# Patient Record
Sex: Male | Born: 1955 | Race: White | Hispanic: No | Marital: Single | Smoking: Former smoker
Health system: Southern US, Community
[De-identification: ages and names within clinical notes are randomized; demographics above are authoritative.]

## PROBLEM LIST (undated history)

## (undated) DIAGNOSIS — Z9151 Personal history of suicidal behavior: Secondary | ICD-10-CM

## (undated) DIAGNOSIS — Z915 Personal history of self-harm: Secondary | ICD-10-CM

## (undated) DIAGNOSIS — F329 Major depressive disorder, single episode, unspecified: Secondary | ICD-10-CM

## (undated) DIAGNOSIS — K7030 Alcoholic cirrhosis of liver without ascites: Secondary | ICD-10-CM

## (undated) DIAGNOSIS — F4310 Post-traumatic stress disorder, unspecified: Secondary | ICD-10-CM

## (undated) DIAGNOSIS — K219 Gastro-esophageal reflux disease without esophagitis: Secondary | ICD-10-CM

## (undated) DIAGNOSIS — F419 Anxiety disorder, unspecified: Secondary | ICD-10-CM

## (undated) DIAGNOSIS — F32A Depression, unspecified: Secondary | ICD-10-CM

## (undated) DIAGNOSIS — M169 Osteoarthritis of hip, unspecified: Secondary | ICD-10-CM

## (undated) HISTORY — PX: WRIST SURGERY: SHX841

---

## 2014-12-14 ENCOUNTER — Emergency Department (HOSPITAL_COMMUNITY)
Admission: EM | Admit: 2014-12-14 | Discharge: 2014-12-15 | Disposition: A | Discharge status: Other Acute Inpatient Hospital | Payer: Medicaid Other | Attending: Emergency Medicine | Admitting: Emergency Medicine

## 2014-12-14 ENCOUNTER — Encounter (HOSPITAL_COMMUNITY): Payer: Self-pay | Admitting: *Deleted

## 2014-12-14 DIAGNOSIS — Z87891 Personal history of nicotine dependence: Secondary | ICD-10-CM | POA: Diagnosis not present

## 2014-12-14 DIAGNOSIS — R45851 Suicidal ideations: Secondary | ICD-10-CM | POA: Insufficient documentation

## 2014-12-14 DIAGNOSIS — R748 Abnormal levels of other serum enzymes: Secondary | ICD-10-CM | POA: Insufficient documentation

## 2014-12-14 DIAGNOSIS — Z8739 Personal history of other diseases of the musculoskeletal system and connective tissue: Secondary | ICD-10-CM | POA: Insufficient documentation

## 2014-12-14 DIAGNOSIS — Z8719 Personal history of other diseases of the digestive system: Secondary | ICD-10-CM | POA: Diagnosis not present

## 2014-12-14 DIAGNOSIS — F10920 Alcohol use, unspecified with intoxication, uncomplicated: Secondary | ICD-10-CM

## 2014-12-14 HISTORY — DX: Personal history of suicidal behavior: Z91.51

## 2014-12-14 HISTORY — DX: Depression, unspecified: F32.A

## 2014-12-14 HISTORY — DX: Osteoarthritis of hip, unspecified: M16.9

## 2014-12-14 HISTORY — DX: Alcoholic cirrhosis of liver without ascites: K70.30

## 2014-12-14 HISTORY — DX: Major depressive disorder, single episode, unspecified: F32.9

## 2014-12-14 HISTORY — DX: Gastro-esophageal reflux disease without esophagitis: K21.9

## 2014-12-14 HISTORY — DX: Anxiety disorder, unspecified: F41.9

## 2014-12-14 HISTORY — DX: Personal history of self-harm: Z91.5

## 2014-12-14 HISTORY — DX: Post-traumatic stress disorder, unspecified: F43.10

## 2014-12-14 LAB — COMPREHENSIVE METABOLIC PANEL
ALT: 363 U/L — AB (ref 0–53)
AST: 206 U/L — ABNORMAL HIGH (ref 0–37)
Albumin: 4.5 g/dL (ref 3.5–5.2)
Alkaline Phosphatase: 84 U/L (ref 39–117)
Anion gap: 11 (ref 5–15)
BUN: 8 mg/dL (ref 6–23)
CHLORIDE: 102 mmol/L (ref 96–112)
CO2: 26 mmol/L (ref 19–32)
CREATININE: 1.05 mg/dL (ref 0.50–1.35)
Calcium: 10.2 mg/dL (ref 8.4–10.5)
GFR, EST AFRICAN AMERICAN: 89 mL/min — AB (ref >90)
GFR, EST NON AFRICAN AMERICAN: 77 mL/min — AB (ref >90)
GLUCOSE: 140 mg/dL — AB (ref 70–99)
Potassium: 3.9 mmol/L (ref 3.5–5.1)
Sodium: 139 mmol/L (ref 135–145)
Total Bilirubin: 1.3 mg/dL — ABNORMAL HIGH (ref 0.3–1.2)
Total Protein: 8.1 g/dL (ref 6.0–8.3)

## 2014-12-14 LAB — SALICYLATE LEVEL

## 2014-12-14 LAB — CBC
HCT: 48.7 % (ref 39.0–52.0)
Hemoglobin: 17 g/dL (ref 13.0–17.0)
MCH: 34.2 pg — ABNORMAL HIGH (ref 26.0–34.0)
MCHC: 34.9 g/dL (ref 30.0–36.0)
MCV: 98 fL (ref 78.0–100.0)
Platelets: 220 10*3/uL (ref 150–400)
RBC: 4.97 MIL/uL (ref 4.22–5.81)
RDW: 13 % (ref 11.5–15.5)
WBC: 7.6 10*3/uL (ref 4.0–10.5)

## 2014-12-14 LAB — ETHANOL: Alcohol, Ethyl (B): 91 mg/dL — ABNORMAL HIGH (ref 0–9)

## 2014-12-14 LAB — ACETAMINOPHEN LEVEL: Acetaminophen (Tylenol), Serum: <10 ug/mL — ABNORMAL LOW (ref 10–30)

## 2014-12-14 MED ORDER — ONDANSETRON HCL 4 MG PO TABS: 4.0000 mg | ORAL_TABLET | Freq: Three times a day (TID) | ORAL | Status: DC | PRN

## 2014-12-14 MED ORDER — ALUM & MAG HYDROXIDE-SIMETH 200-200-20 MG/5ML PO SUSP: 30.0000 mL | ORAL | Status: DC | PRN

## 2014-12-14 MED ORDER — ACETAMINOPHEN 325 MG PO TABS: 650.0000 mg | ORAL_TABLET | ORAL | Status: DC | PRN

## 2014-12-14 MED ORDER — IBUPROFEN 200 MG PO TABS: 600.0000 mg | ORAL_TABLET | Freq: Three times a day (TID) | ORAL | Status: DC | PRN

## 2014-12-14 NOTE — ED Notes (Signed)
Pt states that he has been out of neurontin, wellbutrin and risperdal x 6 months. Pt c/o "rushing thoughts."  When asked if he wants to harm himself he states, "I don't know." pt states that he does not right now but he gets triggered easily and "goes into a range." states that when he gets into these rages he is not sure what will happen. States he needs to get back on his meds. States that he had an ACT team in [place], moved here 8 months ago. His ACT team wants him to re-establish care here. States that they did attempt to help him.

## 2014-12-14 NOTE — ED Notes (Signed)
Pt also c/o self medicating with alcohol. States that he drinks about 6 beers a day. States he drank and smoked weed before coming to ED.

## 2014-12-14 NOTE — ED Provider Notes (Signed)
CSN: [id number]     Arrival date & time 12/14/14  1841 History   First MD Initiated Contact with Patient 12/14/14 2208     Chief Complaint  Patient presents with  . Medical Clearance     (Consider location/radiation/quality/duration/timing/severity/associated sxs/prior Treatment) HPI Comments: Patient is a 59 year old male with a past medical history of PTSD, depression, and previous suicide attempt who presents with depression and suicidal ideas. Patient reports the symptoms have worsened since being off his medication for the past 6 months. He was seeing a psychiatrist in [place] that was managing his medication but he has since moved to [place]. Patient reports being increasingly irritable and having thoughts of hurting himself and others without a specific plan. Patient reports he self-medicates with 6-14 beers per day and occasional marijuana use. He thinks he will improve if he gets back on his medication.    Past Medical History  Diagnosis Date  . PTSD (post-traumatic stress disorder)   . Cirrhosis, alcoholic   . Acid reflux   . Degenerative arthritis of hip   . H/O: attempted suicide    Past Surgical History  Procedure Laterality Date  . Wrist surgery     No family history on file. History  Substance Use Topics  . Smoking status: Former Games developer  . Smokeless tobacco: Not on file  . Alcohol Use: 42.0 oz/week    70 Cans of beer per week    Review of Systems  Psychiatric/Behavioral: Positive for suicidal ideas and dysphoric mood.  All other systems reviewed and are negative.     Allergies  Morphine and related  Home Medications   Prior to Admission medications   Not on File   BP 148/88 mmHg  Pulse 93  Temp(Src) 98.3 F (36.8 C)  Resp 20  Wt 177 lb 5 oz (80.428 kg)  SpO2 98% Physical Exam  Constitutional: He is oriented to person, place, and time. He appears well-developed and well-nourished. No distress.  HENT:  Head: Normocephalic and atraumatic.   Eyes: Conjunctivae are normal.  Neck: Normal range of motion.  Cardiovascular: Normal rate and regular rhythm.  Exam reveals no gallop and no friction rub.   No murmur heard. Pulmonary/Chest: Effort normal and breath sounds normal. He has no wheezes. He has no rales. He exhibits no tenderness.  Abdominal: Soft. He exhibits no distension. There is no tenderness. There is no rebound.  Musculoskeletal: Normal range of motion.  Neurological: He is alert and oriented to person, place, and time. Coordination normal.  Speech is goal-oriented. Moves limbs without ataxia.   Skin: Skin is warm and dry.  Psychiatric:  Flat affect. Dysphoric mood.   Nursing note and vitals reviewed.   ED Course  Procedures (including critical care time) Labs Review Labs Reviewed  ACETAMINOPHEN LEVEL - Abnormal; Notable for the following:    Acetaminophen (Tylenol), Serum <10.0 (*)    All other components within normal limits  CBC - Abnormal; Notable for the following:    MCH 34.2 (*)    All other components within normal limits  COMPREHENSIVE METABOLIC PANEL - Abnormal; Notable for the following:    Glucose, Bld 140 (*)    AST 206 (*)    ALT 363 (*)    Total Bilirubin 1.3 (*)    GFR calc non Af Amer 77 (*)    GFR calc Af Amer 89 (*)    All other components within normal limits  ETHANOL - Abnormal; Notable for the following:  Alcohol, Ethyl (B) 91 (*)    All other components within normal limits  SALICYLATE LEVEL  URINE RAPID DRUG SCREEN (HOSP PERFORMED)    Imaging Review No results found.   EKG Interpretation None      MDM   Final diagnoses:  Suicidal ideation  Alcohol intoxication, uncomplicated  Elevated liver enzymes    Patient will have telepsych evaluation.     Emilia BeckKaitlyn Verdie Barrows, PA-C 12/15/14 [id number]  Shon Batonourtney F Horton, MD 12/16/14 2002

## 2014-12-14 NOTE — ED Notes (Signed)
Patient states that he is unable to continue to get is antidepressant medication. States that he has "been down this road before." He states that he feels he might be a danger to himself or others if is off his medication for too long.

## 2014-12-15 ENCOUNTER — Encounter (HOSPITAL_COMMUNITY): Payer: Self-pay | Admitting: *Deleted

## 2014-12-15 ENCOUNTER — Inpatient Hospital Stay (HOSPITAL_COMMUNITY)
Admission: EM | Admit: 2014-12-15 | Discharge: 2014-12-21 | DRG: 885 | Disposition: A | Discharge status: Home / Self Care | Payer: MEDICAID | Source: Intra-hospital | Attending: Psychiatry | Admitting: Psychiatry

## 2014-12-15 DIAGNOSIS — K7030 Alcoholic cirrhosis of liver without ascites: Secondary | ICD-10-CM | POA: Diagnosis not present

## 2014-12-15 DIAGNOSIS — M549 Dorsalgia, unspecified: Secondary | ICD-10-CM | POA: Diagnosis present

## 2014-12-15 DIAGNOSIS — F10988 Alcohol use, unspecified with other alcohol-induced disorder: Secondary | ICD-10-CM

## 2014-12-15 DIAGNOSIS — F10239 Alcohol dependence with withdrawal, unspecified: Secondary | ICD-10-CM | POA: Diagnosis present

## 2014-12-15 DIAGNOSIS — F319 Bipolar disorder, unspecified: Principal | ICD-10-CM | POA: Diagnosis present

## 2014-12-15 DIAGNOSIS — F419 Anxiety disorder, unspecified: Secondary | ICD-10-CM | POA: Diagnosis not present

## 2014-12-15 DIAGNOSIS — F3130 Bipolar disorder, current episode depressed, mild or moderate severity, unspecified: Secondary | ICD-10-CM | POA: Diagnosis not present

## 2014-12-15 DIAGNOSIS — F1020 Alcohol dependence, uncomplicated: Secondary | ICD-10-CM | POA: Diagnosis present

## 2014-12-15 DIAGNOSIS — M1610 Unilateral primary osteoarthritis, unspecified hip: Secondary | ICD-10-CM | POA: Diagnosis present

## 2014-12-15 DIAGNOSIS — F4310 Post-traumatic stress disorder, unspecified: Secondary | ICD-10-CM

## 2014-12-15 DIAGNOSIS — F10230 Alcohol dependence with withdrawal, uncomplicated: Secondary | ICD-10-CM | POA: Diagnosis not present

## 2014-12-15 DIAGNOSIS — F10188 Alcohol abuse with other alcohol-induced disorder: Secondary | ICD-10-CM | POA: Diagnosis present

## 2014-12-15 DIAGNOSIS — R45851 Suicidal ideations: Secondary | ICD-10-CM | POA: Diagnosis not present

## 2014-12-15 LAB — LIPID PANEL
Cholesterol: 166 mg/dL (ref 0–200)
HDL: 59 mg/dL (ref >39)
LDL Cholesterol: 88 mg/dL (ref 0–99)
TRIGLYCERIDES: 93 mg/dL (ref <150)
Total CHOL/HDL Ratio: 2.8 RATIO
VLDL: 19 mg/dL (ref 0–40)

## 2014-12-15 LAB — RAPID URINE DRUG SCREEN, HOSP PERFORMED
AMPHETAMINES: NOT DETECTED
BARBITURATES: NOT DETECTED
BENZODIAZEPINES: POSITIVE — AB
COCAINE: NOT DETECTED
OPIATES: NOT DETECTED
TETRAHYDROCANNABINOL: POSITIVE — AB

## 2014-12-15 LAB — TSH: TSH: 5.292 u[IU]/mL — AB (ref 0.350–4.500)

## 2014-12-15 MED ORDER — LORAZEPAM 1 MG PO TABS: 1.0000 mg | ORAL_TABLET | Freq: Four times a day (QID) | ORAL | Status: AC | PRN

## 2014-12-15 MED ORDER — ACETAMINOPHEN 325 MG PO TABS
650.0000 mg | ORAL_TABLET | Freq: Four times a day (QID) | ORAL | Status: DC | PRN
Start: 2014-12-15 — End: 2014-12-15

## 2014-12-15 MED ORDER — BUPROPION HCL ER (XL) 150 MG PO TB24
150.0000 mg | ORAL_TABLET | Freq: Every day | ORAL | Status: DC
  Administered 2014-12-15 – 2014-12-21 (×7): 150 mg via ORAL
  Filled 2014-12-15 (×3): qty 1
  Filled 2014-12-15: qty 14
  Filled 2014-12-15 (×5): qty 1

## 2014-12-15 MED ORDER — ONDANSETRON 4 MG PO TBDP: 4.0000 mg | ORAL_TABLET | Freq: Four times a day (QID) | ORAL | Status: AC | PRN

## 2014-12-15 MED ORDER — LORAZEPAM 1 MG PO TABS
1.0000 mg | ORAL_TABLET | Freq: Two times a day (BID) | ORAL | Status: AC
  Administered 2014-12-17 (×2): 1 mg via ORAL
  Filled 2014-12-15 (×2): qty 1

## 2014-12-15 MED ORDER — RISPERIDONE 2 MG PO TABS
2.0000 mg | ORAL_TABLET | Freq: Every day | ORAL | Status: DC
  Administered 2014-12-15 – 2014-12-20 (×6): 2 mg via ORAL
  Filled 2014-12-15: qty 1
  Filled 2014-12-15: qty 14
  Filled 2014-12-15 (×6): qty 1

## 2014-12-15 MED ORDER — LORAZEPAM 1 MG PO TABS
1.0000 mg | ORAL_TABLET | Freq: Four times a day (QID) | ORAL | Status: AC
  Administered 2014-12-15 (×4): 1 mg via ORAL
  Filled 2014-12-15 (×4): qty 1

## 2014-12-15 MED ORDER — MAGNESIUM HYDROXIDE 400 MG/5ML PO SUSP: 30.0000 mL | Freq: Every day | ORAL | Status: DC | PRN

## 2014-12-15 MED ORDER — TRAZODONE HCL 50 MG PO TABS
50.0000 mg | ORAL_TABLET | Freq: Every evening | ORAL | Status: DC | PRN
  Administered 2014-12-15 – 2014-12-20 (×8): 50 mg via ORAL
  Filled 2014-12-15 (×16): qty 1

## 2014-12-15 MED ORDER — LORAZEPAM 1 MG PO TABS
1.0000 mg | ORAL_TABLET | Freq: Three times a day (TID) | ORAL | Status: AC
  Administered 2014-12-16 (×3): 1 mg via ORAL
  Filled 2014-12-15 (×3): qty 1

## 2014-12-15 MED ORDER — LOPERAMIDE HCL 2 MG PO CAPS: 2.0000 mg | ORAL_CAPSULE | ORAL | Status: AC | PRN

## 2014-12-15 MED ORDER — THIAMINE HCL 100 MG/ML IJ SOLN
100.0000 mg | Freq: Once | INTRAMUSCULAR | Status: DC
  Filled 2014-12-15: qty 2

## 2014-12-15 MED ORDER — ALUM & MAG HYDROXIDE-SIMETH 200-200-20 MG/5ML PO SUSP: 30.0000 mL | ORAL | Status: DC | PRN

## 2014-12-15 MED ORDER — GABAPENTIN 300 MG PO CAPS
300.0000 mg | ORAL_CAPSULE | Freq: Four times a day (QID) | ORAL | Status: DC
  Administered 2014-12-15 – 2014-12-16 (×5): 300 mg via ORAL
  Filled 2014-12-15 (×8): qty 1

## 2014-12-15 MED ORDER — ADULT MULTIVITAMIN W/MINERALS CH
1.0000 | ORAL_TABLET | Freq: Every day | ORAL | Status: DC
  Administered 2014-12-15 – 2014-12-21 (×7): 1 via ORAL
  Filled 2014-12-15: qty 1
  Filled 2014-12-15: qty 14
  Filled 2014-12-15 (×8): qty 1

## 2014-12-15 MED ORDER — LORAZEPAM 1 MG PO TABS
1.0000 mg | ORAL_TABLET | Freq: Every day | ORAL | Status: AC
  Administered 2014-12-18: 1 mg via ORAL
  Filled 2014-12-15: qty 1

## 2014-12-15 MED ORDER — VITAMIN B-1 100 MG PO TABS
100.0000 mg | ORAL_TABLET | Freq: Every day | ORAL | Status: DC
  Administered 2014-12-16 – 2014-12-21 (×6): 100 mg via ORAL
  Filled 2014-12-15 (×8): qty 1

## 2014-12-15 MED ORDER — PANTOPRAZOLE SODIUM 40 MG PO TBEC
40.0000 mg | DELAYED_RELEASE_TABLET | Freq: Two times a day (BID) | ORAL | Status: DC
  Administered 2014-12-15 – 2014-12-21 (×12): 40 mg via ORAL
  Filled 2014-12-15 (×2): qty 1
  Filled 2014-12-15: qty 28
  Filled 2014-12-15 (×7): qty 1
  Filled 2014-12-15: qty 28
  Filled 2014-12-15 (×5): qty 1

## 2014-12-15 MED ORDER — CYCLOBENZAPRINE HCL 5 MG PO TABS
7.5000 mg | ORAL_TABLET | Freq: Three times a day (TID) | ORAL | Status: DC | PRN
  Administered 2014-12-15 – 2014-12-17 (×3): 7.5 mg via ORAL
  Filled 2014-12-15: qty 2
  Filled 2014-12-15: qty 1.5

## 2014-12-15 MED ORDER — HYDROXYZINE HCL 25 MG PO TABS: 25.0000 mg | ORAL_TABLET | Freq: Four times a day (QID) | ORAL | Status: AC | PRN

## 2014-12-15 NOTE — H&P (Signed)
Psychiatric Admission Assessment Adult  Patient Identification: William Perez MRN:  [id number] Date of Evaluation:  12/15/2014 Chief Complaint:  Bipolar Disorder Principal Diagnosis: Alcohol abuse with alcohol-induced mental disorder Diagnosis:   Patient Active Problem List   Diagnosis Date Noted  . 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 male who presents to [facility] with SI thoughts x1 month with worsening depression, anxiety and racing thoughts. Pt states he has a plan to overdose on pills. He admits to 2 previous SI attempts by jumping from bridge and overdosing. Pt states he moved from [place] 6 months ago and says he was engaged in outpatient services with ACT in [place]. Pt says he's been off his medications x6 months. Pt says he has become volatile and racing thoughts and drinking to self-medicate with alcohol and THC. He admits drinking 6-12 12pks of beer, daily. His last drink was 12/14/14, he drank 1-16oz beer. He also smokes 1 marijuana cigarette 2-3x's a week. His last use was 2 days ago. Pt says he legal issues--communicating threats, his court date in June 2016. Pt says he unable to obtain his antidepressant medication and wants to get back on his medications.   Today he was seen.  He states that he ran out of meds for a month and is the primary reason why he is in the hospital.  He states that he was brought in by a friend.  He reports being agitated.  He denies SI/HI or even being treated at [facility] but at other places in and out of the state.  His diagnoses include anxiety, depression and PTSD.  He did not care to elaborate at this time.  He also had been with ACT Team out of [place].  His recent meds include Gabapentin and Bupropion.    Elements:  Location:  Agitation, substance abuse. Quality:  Feeling of anxiety, agitation and hopelessness. Severity:  severe. Timing:  in the last  couple of days. Duration:  chronic, ongoing. Context:  see HPI. Associated Signs/Symptoms: Depression Symptoms:   (Hypo) Manic Symptoms:  Irritable Mood, Anxiety Symptoms:  Social Anxiety, Psychotic Symptoms:  NA PTSD Symptoms: patient did not want to share at this moment Total Time spent with patient: 30 minutes  Past Medical History:  Past Medical History  Diagnosis Date  . PTSD (post-traumatic stress disorder)   . Cirrhosis, alcoholic   . Acid reflux   . Degenerative arthritis of hip   . H/O: attempted suicide   . Depression   . Anxiety     Past Surgical History  Procedure Laterality Date  . Wrist surgery     Family History: History reviewed. No pertinent family history. Social History:  History  Alcohol Use  . 42.0 oz/week  . 70 Cans of beer per week    Comment: 6 to 12 beers daily depending on "state of mind"     History  Drug Use  . 4.00 per week  . Special: Marijuana    Comment: 2 to 3 times weekly    History   Social History  . Marital Status: Single    Spouse Name: N/A  . Number of Children: N/A  . Years of Education: N/A   Social History Main Topics  . Smoking status: Former Games developer  . Smokeless tobacco: Not on file  . Alcohol Use: 42.0 oz/week    70 Cans of beer per week     Comment: 6 to 12 beers  daily depending on "state of mind"  . Drug Use: 4.00 per week    Special: Marijuana     Comment: 2 to 3 times weekly  . Sexual Activity: Yes    Birth Control/ Protection: None   Other Topics Concern  . None   Social History Narrative   Additional Social History:    Pain Medications: see mar Prescriptions: see mar Over the Counter: see mar History of alcohol / drug use?: Yes Longest period of sobriety (when/how long): apprx 9 months in 2013 Negative Consequences of Use: Financial, Legal, Personal relationships  Musculoskeletal: Strength & Muscle Tone: within normal limits Gait & Station: normal Patient leans: N/A  Psychiatric Specialty  Exam: Physical Exam  Vitals reviewed. Psychiatric: His mood appears anxious.    Review of Systems  Psychiatric/Behavioral: Positive for depression. The patient is nervous/anxious.   All other systems reviewed and are negative.   Blood pressure 122/92, pulse 81, temperature 98.2 F (36.8 C), temperature source Oral, resp. rate 16, height 5' 9.75" (1.772 m), weight 78.926 kg (174 lb).Body mass index is 25.14 kg/(m^2).  General Appearance: Fairly Groomed  Patent attorney::  Fair  Speech:  Normal Rate  Volume:  Normal  Mood:  Anxious  Affect:  Depressed and Flat  Thought Process:  Linear  Orientation:  Full (Time, Place, and Person)  Thought Content:  Rumination  Suicidal Thoughts:  No  Homicidal Thoughts:  No  Memory:  Immediate;   Fair Recent;   Fair Remote;   Fair  Judgement:  Fair  Insight:  Fair  Psychomotor Activity:  Normal  Concentration:  Good  Recall:  Good  Fund of Knowledge:Good  Language: Good  Akathisia:  Negative  Handed:  Right  AIMS (if indicated):     Assets:  Desire for Improvement Resilience Social Support  ADL's:  Intact  Cognition: WNL  Sleep:      Risk to Self:   Risk to Others:   Prior Inpatient Therapy:   Prior Outpatient Therapy:    Alcohol Screening: 1. How often do you have a drink containing alcohol?: 4 or more times a week 2. How many drinks containing alcohol do you have on a typical day when you are drinking?: 7, 8, or 9 3. How often do you have six or more drinks on one occasion?: Daily or almost daily Preliminary Score: 7 4. How often during the last year have you found that you were not able to stop drinking once you had started?: Daily or almost daily 5. How often during the last year have you failed to do what was normally expected from you becasue of drinking?: Less than monthly 6. How often during the last year have you needed a first drink in the morning to get yourself going after a heavy drinking session?: Weekly 7. How often  during the last year have you had a feeling of guilt of remorse after drinking?: Weekly 8. How often during the last year have you been unable to remember what happened the night before because you had been drinking?: Daily or almost daily 9. Have you or someone else been injured as a result of your drinking?: Yes, but not in the last year 10. Has a relative or friend or a doctor or another health worker been concerned about your drinking or suggested you cut down?: Yes, during the last year Alcohol Use Disorder Identification Test Final Score (AUDIT): 32 Brief Intervention: Yes  Allergies:   Allergies  Allergen Reactions  . Morphine  And Related Hives   Lab Results:  Results for orders placed or performed during the hospital encounter of 12/15/14 (from the past 48 hour(s))  Lipid panel, fasting     Status: None   Collection Time: 12/15/14  6:45 AM  Result Value Ref Range   Cholesterol 166 0 - 200 mg/dL   Triglycerides 93 <161<150 mg/dL   HDL 59 >09>39 mg/dL   Total CHOL/HDL Ratio 2.8 RATIO   VLDL 19 0 - 40 mg/dL   LDL Cholesterol 88 0 - 99 mg/dL    Comment:        Total Cholesterol/HDL:CHD Risk Coronary Heart Disease Risk Table                     Men   Women  1/2 Average Risk   3.4   3.3  Average Risk       5.0   4.4  2 X Average Risk   9.6   7.1  3 X Average Risk  23.4   11.0        Use the calculated Patient Ratio above and the CHD Risk Table to determine the patient's CHD Risk.        ATP III CLASSIFICATION (LDL):  <100     mg/dL   Optimal  [id number]  mg/dL   Near or Above                    Optimal  130-159  mg/dL   Borderline  [id number]  mg/dL   High  >478>190     mg/dL   Very High Performed at [facility]   TSH     Status: Abnormal   Collection Time: 12/15/14  6:45 AM  Result Value Ref Range   TSH 5.292 (H) 0.350 - 4.500 uIU/mL    Comment: Performed at [facility]   Current Medications: Current Facility-Administered Medications  Medication Dose  Route Frequency Provider Last Rate Last Dose  . alum & mag hydroxide-simeth (MAALOX/MYLANTA) 200-200-20 MG/5ML suspension 30 mL  30 mL Oral Q4H PRN Kerry HoughSpencer E Simon, PA-C      . hydrOXYzine (ATARAX/VISTARIL) tablet 25 mg  25 mg Oral Q6H PRN Kerry HoughSpencer E Simon, PA-C      . loperamide (IMODIUM) capsule 2-4 mg  2-4 mg Oral PRN Kerry HoughSpencer E Simon, PA-C      . LORazepam (ATIVAN) tablet 1 mg  1 mg Oral Q6H PRN Kerry HoughSpencer E Simon, PA-C      . LORazepam (ATIVAN) tablet 1 mg  1 mg Oral QID Kerry HoughSpencer E Simon, PA-C   1 mg at 12/15/14 1202   Followed by  . [START ON 12/16/2014] LORazepam (ATIVAN) tablet 1 mg  1 mg Oral TID Kerry HoughSpencer E Simon, PA-C       Followed by  . [START ON 12/17/2014] LORazepam (ATIVAN) tablet 1 mg  1 mg Oral BID Kerry HoughSpencer E Simon, PA-C       Followed by  . [START ON 12/18/2014] LORazepam (ATIVAN) tablet 1 mg  1 mg Oral Daily Spencer E Simon, PA-C      . magnesium hydroxide (MILK OF MAGNESIA) suspension 30 mL  30 mL Oral Daily PRN Kerry HoughSpencer E Simon, PA-C      . multivitamin with minerals tablet 1 tablet  1 tablet Oral Daily Kerry HoughSpencer E Simon, PA-C   1 tablet at 12/15/14 [id number]  . ondansetron (ZOFRAN-ODT) disintegrating tablet 4 mg  4 mg Oral Q6H PRN Kerry HoughSpencer E Simon, PA-C      .  thiamine (B-1) injection 100 mg  100 mg Intramuscular Once Kerry Hough, PA-C   100 mg at 12/15/14 1610  . [START ON 12/16/2014] thiamine (VITAMIN B-1) tablet 100 mg  100 mg Oral Daily Kerry Hough, PA-C      . traZODone (DESYREL) tablet 50 mg  50 mg Oral QHS,MR X 1 Kerry Hough, PA-C       PTA Medications: Prescriptions prior to admission  Medication Sig Dispense Refill Last Dose  . gabapentin (NEURONTIN) 800 MG tablet Take 800 mg by mouth 4 (four) times daily.   Past Month at Unknown time  . Multiple Vitamin (MULTIVITAMIN WITH MINERALS) TABS tablet Take 1 tablet by mouth daily.   Past Week at Unknown time    Previous Psychotropic Medications: Yes   Substance Abuse History in the last 12 months:  Yes.    Consequences of  Substance Abuse: crisis management  Results for orders placed or performed during the hospital encounter of 12/15/14 (from the past 72 hour(s))  Lipid panel, fasting     Status: None   Collection Time: 12/15/14  6:45 AM  Result Value Ref Range   Cholesterol 166 0 - 200 mg/dL   Triglycerides 93 <960 mg/dL   HDL 59 >45 mg/dL   Total CHOL/HDL Ratio 2.8 RATIO   VLDL 19 0 - 40 mg/dL   LDL Cholesterol 88 0 - 99 mg/dL    Comment:        Total Cholesterol/HDL:CHD Risk Coronary Heart Disease Risk Table                     Men   Women  1/2 Average Risk   3.4   3.3  Average Risk       5.0   4.4  2 X Average Risk   9.6   7.1  3 X Average Risk  23.4   11.0        Use the calculated Patient Ratio above and the CHD Risk Table to determine the patient's CHD Risk.        ATP III CLASSIFICATION (LDL):  <100     mg/dL   Optimal  409-811  mg/dL   Near or Above                    Optimal  130-159  mg/dL   Borderline  914-782  mg/dL   High  >956     mg/dL   Very High Performed at [facility]   TSH     Status: Abnormal   Collection Time: 12/15/14  6:45 AM  Result Value Ref Range   TSH 5.292 (H) 0.350 - 4.500 uIU/mL    Comment: Performed at [facility]    Observation Level/Precautions:  15 minute checks  Laboratory:  per ED  Psychotherapy:  group  Medications:  As per medlist  Consultations:  As needed  Discharge Concerns:  safety  Estimated LOS:  2-7 days  Other:     Psychological Evaluations: Yes   Treatment Plan Summary: Admit for crisis management and mood stabilization. Medication management to re-stabilize current mood symptoms.  Gabapentin 300 mg QID mood sx, Riperdal 2 mg QHS for mood stabilization, Bupropion XL 150 mg QD for depression. Group counseling sessions for coping skills Medical consults as needed Review and reinstate any pertinent home medications for other health problems   Medical Decision Making:  Review of Psycho-Social Stressors  (1), Discuss test with performing physician (  1), Decision to obtain old records (1), Review and summation of old records (2) and Review of New Medication or Change in Dosage (2)  I certify that inpatient services furnished can reasonably be expected to improve the patient's condition.   Velna Hatchet May Agustin AGNP-BC 4/28/201612:31 PM I personally assessed the patient, reviewed the physical exam and labs and formulated the treatment plan Madie Reno A. Dub Mikes, M.D.

## 2014-12-15 NOTE — Progress Notes (Signed)
Pt was pleasant and cooperative, but flat and depressed during the adm process. When asked about the circumstances surrounding his adm pt stated, "got to get on my meds. Things have gotten too far out crazy". Pt stated this several times and always added referred to his relationship with his girlfriend. However, pt wouldn't go into details with the writer even after being asked several times. At one point pt stated, "i've been edgy". Pt moved from [place] apprx 8 months ago to live his his gf. Stated he only had enough medicine to last for 3 months but that he "stretched it" so it would last longer. Stated that in [place] he was followed by an ACT team. Pt states he's been self medicating with etoh and hasn't taken any medications in 4 to 6 wks. Per report pt stated, "If i don't have my meds I don't know what might happen. Hurt myself or somebody else". Per pt there is a history of GERD, DDD, L hip problems, cirrhosis and ulcers. 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 Pt voiced no questions or concerns.

## 2014-12-15 NOTE — [facility] Group Notes (Signed)
The focus of this group is to educate the patient on the purpose and policies of crisis stabilization and provide a format to answer questions about their admission.  The group details unit policies and expectations of patients while admitted. Patient did not attend this group. 

## 2014-12-15 NOTE — ED Provider Notes (Signed)
Patient has been accepted at [facility] by Dr. Dub MikesLugo.   Dione Boozeavid Amerika Nourse, MD 12/15/14 [phone]

## 2014-12-15 NOTE — [facility] Group Notes (Signed)
[facility] LCSW Group Therapy  12/15/2014 3:13 PM  Type of Therapy:  Group Therapy  Participation Level:  Did Not Attend-pt invited. Chose to remain in bed.   Summary of Progress/Problems: Today's Topic: Overcoming Obstacles. Pt identified obstacles faced currently and processed barriers involved in overcoming these obstacles. Pt identified steps necessary for overcoming these obstacles and explored motivation (internal and external) for facing these difficulties head on. Pt further identified one area of concern in their lives and chose a skill of focus pulled from their "toolbox."   Smart, [place] LCSWA  12/15/2014, 3:13 PM

## 2014-12-15 NOTE — Tx Team (Signed)
Initial Interdisciplinary Treatment Plan   PATIENT STRESSORS: Financial difficulties Health problems Legal issue Marital or family conflict Medication change or noncompliance Substance abuse   PATIENT STRENGTHS: Average or above average intelligence Capable of independent living Communication skills General fund of knowledge Motivation for treatment/growth Physical Health Work skills   PROBLEM LIST: Problem List/Patient Goals Date to be addressed Date deferred Reason deferred Estimated date of resolution  " Got to get back on my meds things have gotten to far to crazy" 12/15/14           Depression 12/15/14     Increased risk for suicide 12/15/14     Substance abuse 12/15/14     Concurrent medical condition 12/15/14                        DISCHARGE CRITERIA:  Ability to meet basic life and health needs Adequate post-discharge living arrangements Improved stabilization in mood, thinking, and/or behavior Medical problems require only outpatient monitoring Motivation to continue treatment in a less acute level of care Need for constant or close observation no longer present Reduction of life-threatening or endangering symptoms to within safe limits Safe-care adequate arrangements made Verbal commitment to aftercare and medication compliance Withdrawal symptoms are absent or subacute and managed without 24-hour nursing intervention  PRELIMINARY DISCHARGE PLAN: Attend 12-step recovery group Outpatient therapy Participate in family therapy Return to previous living arrangement  PATIENT/FAMIILY INVOLVEMENT: This treatment plan has been presented to and reviewed with the patient, Suan HalterDale Rickey, and/or family member.  The patient and family have been given the opportunity to ask questions and make suggestions.  Fransico MichaelBrooks, Nickalos Petersen Laverne 12/15/2014, 6:13 AM

## 2014-12-15 NOTE — Progress Notes (Signed)
D: Patient denies SI/HI and A/V hallucinations  A: Monitored q 15 minutes; patient encouraged to attend groups; patient educated about medications; patient given medications per physician orders; patient encouraged to express feelings and/or concerns  R: Patient is fatigued and has been in the bed most of the day; patient is cooperative and pleasant; patient's interaction with staff and peers is minimal; patient was able to set goal to talk with staff 1:1 when having feelings of SI; patient is taking medications as prescribed and tolerating medications; patient is attended the afternoon group
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
## 2014-12-15 NOTE — BH Assessment (Signed)
Tele Assessment Note   William Perez is a 59 y.o. male who presents to [facility] with SI thoughts x1 month with worsening depression, anxiety and racing thoughts. Pt states he has a plan to overdose on pills.  He admits 2 previous SI attempts by jumping from bridge and overdosing.  Pt states he moved from [place] 6 months ago and says he was engaged in outpatient services with ACT in [place].  Pt says he's been off his medications x6 months. Pt says he has become volatile and racing thoughts and drinking to self-medicate with alcohol and thc.  He admits drinking 6-12 12pks of beer, daily. His last drink was 12/14/14, he drank 1-16oz beer.  He also smokes 1 marijuana cigarette 2-3x's a week. His last use was 2 days ago.  Pt says he legal issues--communicating threats, his court date in June 2016.  Pt says he unable to obtain his antidepressant medication and wants to get back on his medications.    Axis I: Bipolar I disorder, Current or most recent episode depressed, Severe;Posttraumatic stress disorder; Alcohol use disorder, Severe;Cannabis use disorder, Mild   Axis II: Deferred Axis III:  Past Medical History  Diagnosis Date  . PTSD (post-traumatic stress disorder)   . Cirrhosis, alcoholic   . Acid reflux   . Degenerative arthritis of hip   . H/O: attempted suicide   . Depression   . Anxiety    Axis IV: other psychosocial or environmental problems, problems related to social environment, problems with access to health care services and problems with primary support group Axis V: 31-40 impairment in reality testing  Past Medical History:  Past Medical History  Diagnosis Date  . PTSD (post-traumatic stress disorder)   . Cirrhosis, alcoholic   . Acid reflux   . Degenerative arthritis of hip   . H/O: attempted suicide   . Depression   . Anxiety     Past Surgical History  Procedure Laterality Date  . Wrist surgery      Family History: No family history on file.  Social History:   reports that he has quit smoking. He does not have any smokeless tobacco history on file. He reports that he drinks about 42.0 oz of alcohol per week. He reports that he uses illicit drugs (Marijuana) about 4 times per week.  Additional Social History:  Alcohol / Drug Use Pain Medications: See MAR  Prescriptions: See MAR  Over the Counter: See MAR  History of alcohol / drug use?: Yes Longest period of sobriety (when/how long): Only when detox  Negative Consequences of Use: Work / Programmer, multimedia, Copywriter, advertising relationships, Armed forces operational officer, Surveyor, quantity Substance #1 Name of Substance 1: Alcohol  1 - Age of First Use: Teens  1 - Amount (size/oz): 6-12 12pk's  1 - Frequency: Daily  1 - Duration: On-going  1 - Last Use / Amount: 12/14/14 Substance #2 Name of Substance 2: THC  2 - Age of First Use: Teens  2 - Amount (size/oz): 1 Joint 2 - Frequency: 2-3x's Wkly  2 - Duration: On-going  2 - Last Use / Amount: 2 Days Ago   CIWA: CIWA-Ar BP: 148/88 mmHg Pulse Rate: 93 COWS:    PATIENT STRENGTHS: (choose at least two) Communication skills Motivation for treatment/growth  Allergies:  Allergies  Allergen Reactions  . Morphine And Related Hives    Home Medications:  (Not in a hospital admission)  OB/GYN Status:  No LMP for male patient.  General Assessment Data Location of Assessment: [facility] ED Is this  a Tele or Face-to-Face Assessment?: Tele Assessment Is this an Initial Assessment or a Re-assessment for this encounter?: Initial Assessment Living Arrangements: Spouse/significant other Can pt return to current living arrangement?: Yes Admission Status: Voluntary Is patient capable of signing voluntary admission?: Yes Transfer from: Home Referral Source: Self/Family/Friend  Medical Screening Exam [facility] Walk-in ONLY) Medical Exam completed: No Reason for MSE not completed: Other:  [facility] Living Arrangements: Spouse/significant other Name of Psychiatrist: None  Name of Therapist: None    Education Status Is patient currently in school?: No Current Grade: None  Highest grade of school patient has completed: None  Name of school: None  Contact person: None   Risk to self with the past 6 months Suicidal Ideation: Yes-Currently Present Suicidal Intent: Yes-Currently Present Is patient at risk for suicide?: Yes Suicidal Plan?: Yes-Currently Present Specify Current Suicidal Plan: Overdose  Access to Means: Yes Specify Access to Suicidal Means: Pills  What has been your use of drugs/alcohol within the last 12 months?: Abusing: alcohol, THC  Previous Attempts/Gestures: Yes How many times?: 2 Other Self Harm Risks: None  Triggers for Past Attempts: Unpredictable, Other personal contacts Intentional Self Injurious Behavior: None Family Suicide History: No Recent stressful life event(s): Other (Comment) (Off meds x6 months ) Persecutory voices/beliefs?: No Depression: Yes Depression Symptoms: Loss of interest in usual pleasures, Insomnia, Feeling worthless/self pity, Fatigue, Feeling angry/irritable Substance abuse history and/or treatment for substance abuse?: Yes Suicide prevention information given to non-admitted patients: Not applicable  Risk to Others within the past 6 months Homicidal Ideation: No Thoughts of Harm to Others: No Current Homicidal Intent: No Current Homicidal Plan: No Access to Homicidal Means: No Identified Victim: None  History of harm to others?: No Assessment of Violence: None Noted Violent Behavior Description: None  Does patient have access to weapons?: No Criminal Charges Pending?: Yes Describe Pending Criminal Charges: Communicating Threats  Does patient have a court date: Yes Court Date: 01/18/15  Psychosis Hallucinations: Auditory Delusions: None noted  Mental Status Report Appearance/Hygiene: Other (Comment) (Appropriate ) Eye Contact: Good Motor Activity: Unremarkable Speech: Logical/coherent, Pressured Level of  Consciousness: Alert Mood: Depressed, Anxious Affect: Anxious, Depressed Anxiety Level: Minimal Thought Processes: Coherent, Relevant Judgement: Impaired Orientation: Person, Place, Time, Situation Obsessive Compulsive Thoughts/Behaviors: None  Cognitive Functioning Concentration: Normal Memory: Recent Intact, Remote Intact IQ: Average Insight: Poor Impulse Control: Poor Appetite: Good Weight Loss: 0 Weight Gain: 0 Sleep: Decreased Total Hours of Sleep: 5 Vegetative Symptoms: None  ADLScreening [facility] Assessment Services) Patient's cognitive ability adequate to safely complete daily activities?: Yes Patient able to express need for assistance with ADLs?: Yes Independently performs ADLs?: Yes (appropriate for developmental age)  Prior Inpatient Therapy Prior Inpatient Therapy: Yes Prior Therapy Dates: Unk  Prior Therapy [place]ilty/[place], [place], [place]  Reason for Treatment: SI/Depression/SA   Prior Outpatient Therapy Prior Outpatient Therapy: Yes Prior Therapy Dates: 2015 Prior Therapy Facilty/Provider(s): [facility]  Reason for Treatment: Med Mgt/Therapy   ADL Screening (condition at time of admission) Patient's cognitive ability adequate to safely complete daily activities?: Yes Is the patient deaf or have difficulty hearing?: No Does the patient have difficulty seeing, even when wearing glasses/contacts?: No Does the patient have difficulty concentrating, remembering, or making decisions?: Yes Patient able to express need for assistance with ADLs?: Yes Does the patient have difficulty dressing or bathing?: No Independently performs ADLs?: Yes (appropriate for developmental age) Does the patient have difficulty walking or climbing stairs?: No Weakness of Legs: None  Weakness of Arms/Hands: None  Home Assistive Devices/Equipment Home Assistive Devices/Equipment: None  Therapy Consults (therapy consults require a physician  order) PT Evaluation Needed: No OT Evalulation Needed: No SLP Evaluation Needed: No Abuse/Neglect Assessment (Assessment to be complete while patient is alone) Physical Abuse: Denies Verbal Abuse: Denies Sexual Abuse: Denies Exploitation of patient/patient's resources: Denies Self-Neglect: Denies Values / Beliefs Cultural Requests During Hospitalization: None Spiritual Requests During Hospitalization: None Consults Spiritual Care Consult Needed: No Social Work Consult Needed: No Merchant navy officer (For Healthcare) Does patient have an advance directive?: No Would patient like information on creating an advanced directive?: No - patient declined information    Additional Information 1:1 In Past 12 Months?: No CIRT Risk: No Elopement Risk: No Does patient have medical clearance?: Yes     Disposition:  Disposition Initial Assessment Completed for this Encounter: Yes Disposition of Patient: Inpatient treatment program, Referred to [facility], [place] meets criteria for inpt admit [id number]) Type of inpatient treatment program: Adult Patient referred to: Other (Comment) Donell Sievert, [place] meets criteria for inpt admit; [id number])  Murrell Redden 12/15/2014 1:05 AM

## 2014-12-15 NOTE — Tx Team (Signed)
Interdisciplinary Treatment Plan Update (Adult)   Date: 12/15/2014   Time Reviewed: 8:59 AM  Progress in Treatment:  Attending groups: No-new to unit.  Participating in groups:  No-new to unit.  Taking medication as prescribed: Yes  Tolerating medication: Yes  Family/Significant othe contact made: Not yet. SPE required for this pt.   Patient understands diagnosis: Yes, AEB seeking treatment for SI, medication stabilization, depression/racing thoughts/impulsivity, and for ETOH/THC abuse.  Discussing patient identified problems/goals with staff: Yes  Medical problems stabilized or resolved: Yes  Denies suicidal/homicidal ideation: Yes.  Patient has not harmed self or Others: Yes  New problem(s) identified:  Discharge Plan or Barriers: Pt not willing to discuss aftercare at this time and is in room//irritable/isolative. CSW assessing. PSA needed.  Additional comments: William Perez is a 59 y.o. male who [facility] with SI thoughts x1 month with worsening depression, anxiety and racing thoughts. Pt states he has a plan to overdose on pills. He admits 2 previous SI attempts by jumping from bridge and overdosing. Pt states he moved from [place] 6 months ago and says he was engaged in outpatient services with [facility] in [place]. Pt says he's been off his medications x6 months. Pt says he has become volatile and racing thoughts and drinking to self-medicate with alcohol and thc. He admits drinking 6-12 12pks of beer, daily. His last drink was 12/14/14, he drank 1-16oz beer. He also smokes 1 marijuana cigarette 2-3x's a week. His last use was 2 days ago. Pt says he legal issues--communicating threats, his court date in June 2016. Pt says he unable to obtain his antidepressant medication and wants to get back on his medications.  Reason for Continuation of Hospitalization: Ativan taper-withdrawals SI Medication stabilization Mood instability/depression Estimated length of stay: 3-5 days   For review of initial/current patient goals, please see plan of care.  Attendees:  Patient:    Family:    Physician: Geoffery LyonsIrving Lugo MD 12/15/2014 9:00 AM   Nursing: Brayton ElBritney RN; Huntley DecSara RN 12/15/2014 9:00 AM   Clinical Social Worker Chaz Ronning Smart, LCSWA  12/15/2014 9:00 AM   Other: Earley AbideKristin D. LCSWA 12/15/2014 9:00 AM   Other: Darden DatesJennifer C. Nurse CM 12/15/2014 9:00 AM   Other: Liliane Badeolora Sutton, Community Care Coordinator  12/15/2014 9:00 AM   Other:    Scribe for Treatment Team:  The Sherwin-WilliamsHeather Smart LCSWA 12/15/2014 9:00 AM

## 2014-12-16 LAB — HEMOGLOBIN A1C
Hgb A1c MFr Bld: 5.5 % (ref 4.8–5.6)
Mean Plasma Glucose: 111 mg/dL

## 2014-12-16 MED ORDER — GABAPENTIN 400 MG PO CAPS
400.0000 mg | ORAL_CAPSULE | Freq: Four times a day (QID) | ORAL | Status: DC
  Administered 2014-12-16 – 2014-12-17 (×4): 400 mg via ORAL
  Filled 2014-12-16 (×13): qty 1

## 2014-12-16 NOTE — [facility] Group Notes (Signed)
[facility]   12/16/2014 11:31 AM  Participation Quality:  Appropriate   Mood/Affect:  Appropriate  Depression Rating:  4  Anxiety Rating:  5  Thoughts of Suicide:  No Will you contract for safety?   NA  Current AVH:  No  Plan for Discharge/Comments:  Pt reports that he is unsure if he plans to return to his [place]f in [place] or go back to [place]. [facility] [place] and stated that he has [facility] team through [facility] [place] if he decides to return there. He is interested in getting [employer] info and [employer] info by d/c.   Transportation Means: unknown at this time.   Supports: girlfriend is limited support. Pt does not identify any other supports at this time.   Smart, American FinancialHeather LCSWA

## 2014-12-16 NOTE — B[facility] Group Notes (Signed)
[facility] LCSW Group Therapy  12/16/2014 3:54 PM  Type of Therapy:  Group Therapy  Participation Level:  Active  Participation Quality:  Attentive  Affect:  Appropriate  Cognitive:  Alert and Oriented  Insight:  Improving  Engagement in Therapy:  Improving  Modes of Intervention:  Confrontation, Discussion, Education, Exploration, Problem-solving, Rapport Building, Socialization and Support  Summary of Progress/Problems: Feelings around Relapse. Group members discussed the meaning of relapse and shared personal stories of relapse, how it affected them and others, and how they perceived themselves during this time. Group members were encouraged to identify triggers, warning signs and coping skills used when facing the possibility of relapse. Social supports were discussed and explored in detail. Post Acute Withdrawal Syndrome (handout provided) was introduced and examined. Pt's were encouraged to ask questions, talk about key points associated with PAWS, and process this information in terms of relapse prevention. William Perez was attentive and engaged during today's processing group. He shared his prior experiences with PAWS and stated that his girlfriend needs to read about PAWs and work with him to develop his safety plan. He continues to show progress in the group setting with improving insight AEB his ability to identify ways to avoid relapse-"I really like going to [employer] meetings and [place] [facility] when I lived out there. I did really well then."   Smart, Zebulin Siegel LCSWA  12/16/2014, 3:54 PM

## 2014-12-16 NOTE — Progress Notes (Signed)
Recreation Therapy Notes  Date: 04.29.2016 Time: 9:30am  Location: [address] Group Room   Group Topic: Stress Management  Goal Area(s) Addresses:  Patient will actively participate in stress management techniques presented during session.   Behavioral Response: Did not attend.   Marykay Lexenise L Goldman Birchall, LRT/CTRS  Jersey Ravenscroft L 12/16/2014 1:04 PM

## 2014-12-16 NOTE — Progress Notes (Signed)
[facility] MD Progress Note  12/16/2014 3:21 PM William Perez  MRN:  [id number] Subjective:  William Perez is having a hard time as there has been some conflict with his GF. States she has never seen him off medications as she did before he came here. He states he can turned into a really angry vicious person. He states he does not want to get to that point again. He states that while he was in [place] he had services that allowed him to stay on medications what kept him from relapsing. States that he came here with 3 months worth of medications. As he was running out he started to drop the dose to have some left.  He completley ran out what triggered his drinking.  Principal Problem: Bipolar I disorder, most recent episode depressed Diagnosis:   Patient Active Problem List   Diagnosis Date Noted  . PTSD (post-traumatic stress disorder) [F43.10] 12/15/2014  . Alcohol dependence [F10.20] 12/15/2014  . Bipolar I disorder, most recent episode depressed [F31.30] 12/15/2014   Total Time spent with patient: 30 minutes   Past Medical History:  Past Medical History  Diagnosis Date  . PTSD (post-traumatic stress disorder)   . Cirrhosis, alcoholic   . Acid reflux   . Degenerative arthritis of hip   . H/O: attempted suicide   . Depression   . Anxiety     Past Surgical History  Procedure Laterality Date  . Wrist surgery     Family History: History reviewed. No pertinent family history. Social History:  History  Alcohol Use  . 42.0 oz/week  . 70 Cans of beer per week    Comment: 6 to 12 beers daily depending on "state of mind"     History  Drug Use  . 4.00 per week  . Special: Marijuana    Comment: 2 to 3 times weekly    History   Social History  . Marital Status: Single    Spouse Name: N/A  . Number of Children: N/A  . Years of Education: N/A   Social History Main Topics  . Smoking status: Former Games developer  . Smokeless tobacco: Not on file  . Alcohol Use: 42.0 oz/week    70 Cans of beer  per week     Comment: 6 to 12 beers daily depending on "state of mind"  . Drug Use: 4.00 per week    Special: Marijuana     Comment: 2 to 3 times weekly  . Sexual Activity: Yes    Birth Control/ Protection: None   Other Topics Concern  . None   Social History Narrative   Additional History:    Sleep: Fair  Appetite:  Fair   Assessment:   Musculoskeletal: Strength & Muscle Tone: within normal limits Gait & Station: normal Patient leans: N/A   Psychiatric Specialty Exam: Physical Exam  Review of Systems  Constitutional: Negative.   HENT: Negative.   Eyes: Negative.   Respiratory: Negative.   Cardiovascular: Negative.   Gastrointestinal: Negative.   Genitourinary: Negative.   Musculoskeletal: Negative.   Skin: Negative.   Neurological: Negative.   Endo/Heme/Allergies: Negative.   Psychiatric/Behavioral: Positive for depression and substance abuse. The patient is nervous/anxious.     Blood pressure 141/87, pulse 95, temperature 98.1 F (36.7 C), temperature source Oral, resp. rate 16, height 5' 9.75" (1.772 m), weight 78.926 kg (174 lb).Body mass index is 25.14 kg/(m^2).  General Appearance: Fairly Groomed  Patent attorney::  Fair  Speech:  Clear and Coherent  Volume:  fluctuates  Mood:  Anxious and worried  Affect:  Restricted  Thought Process:  Coherent and Goal Directed  Orientation:  Full (Time, Place, and Person)  Thought Content:  symptoms events worries concerns  Suicidal Thoughts:  No  Homicidal Thoughts:  No  Memory:  Immediate;   Fair Recent;   Fair Remote;   Fair  Judgement:  Fair  Insight:  Present  Psychomotor Activity:  Restlessness  Concentration:  Fair  Recall:  [name] of Knowledge:Fair  Language: Fair  Akathisia:  No  Handed:  Right  AIMS (if indicated):     Assets:  Desire for Improvement Housing Social Support  ADL's:  Intact  Cognition: WNL  Sleep:  Number of Hours: 5.75     Current Medications: Current  Facility-Administered Medications  Medication Dose Route Frequency Provider Last Rate Last Dose  . alum & mag hydroxide-simeth (MAALOX/MYLANTA) 200-200-20 MG/5ML suspension 30 mL  30 mL Oral Q4H PRN Kerry Hough, PA-C      . buPROPion (WELLBUTRIN XL) 24 hr tablet 150 mg  150 mg Oral Daily Adonis Brook, NP   150 mg at 12/16/14 0745  . cyclobenzaprine (FLEXERIL) tablet 7.5 mg  7.5 mg Oral TID PRN Worthy Flank, NP   7.5 mg at 12/16/14 0814  . gabapentin (NEURONTIN) capsule 300 mg  300 mg Oral QID Adonis Brook, NP   300 mg at 12/16/14 1159  . hydrOXYzine (ATARAX/VISTARIL) tablet 25 mg  25 mg Oral Q6H PRN Kerry Hough, PA-C      . loperamide (IMODIUM) capsule 2-4 mg  2-4 mg Oral PRN Kerry Hough, PA-C      . LORazepam (ATIVAN) tablet 1 mg  1 mg Oral Q6H PRN Kerry Hough, PA-C      . LORazepam (ATIVAN) tablet 1 mg  1 mg Oral TID Kerry Hough, PA-C   1 mg at 12/16/14 1159   Followed by  . [START ON 12/17/2014] LORazepam (ATIVAN) tablet 1 mg  1 mg Oral BID Kerry Hough, PA-C       Followed by  . [START ON 12/18/2014] LORazepam (ATIVAN) tablet 1 mg  1 mg Oral Daily Spencer E Simon, PA-C      . magnesium hydroxide (MILK OF MAGNESIA) suspension 30 mL  30 mL Oral Daily PRN Kerry Hough, PA-C      . multivitamin with minerals tablet 1 tablet  1 tablet Oral Daily Kerry Hough, PA-C   1 tablet at 12/16/14 0745  . ondansetron (ZOFRAN-ODT) disintegrating tablet 4 mg  4 mg Oral Q6H PRN Kerry Hough, PA-C      . pantoprazole (PROTONIX) EC tablet 40 mg  40 mg Oral BID AC Adonis Brook, NP   40 mg at 12/16/14 0617  . risperiDONE (RISPERDAL) tablet 2 mg  2 mg Oral QHS Adonis Brook, NP   2 mg at 12/15/14 2105  . thiamine (B-1) injection 100 mg  100 mg Intramuscular Once Kerry Hough, PA-C   100 mg at 12/15/14 1610  . thiamine (VITAMIN B-1) tablet 100 mg  100 mg Oral Daily Kerry Hough, PA-C   100 mg at 12/16/14 9604  . traZODone (DESYREL) tablet 50 mg  50 mg Oral QHS,MR X 1  Kerry Hough, PA-C   50 mg at 12/15/14 2106    Lab Results:  Results for orders placed or performed during the hospital encounter of 12/15/14 (from the past 48 hour(s))  Hemoglobin A1c  Status: None   Collection Time: 12/15/14  6:45 AM  Result Value Ref Range   Hgb A1c MFr Bld 5.5 4.8 - 5.6 %    Comment: (NOTE)         Pre-diabetes: 5.7 - 6.4         Diabetes: >6.4         Glycemic control for adults with diabetes: <7.0    Mean Plasma Glucose 111 mg/dL    Comment: (NOTE) Performed At: [facility] [address] [place], [place] [id number] Mila HomerHancock William F MD WU:[id number]:[phone] Performed at [facility]   Lipid panel, fasting     Status: None   Collection Time: 12/15/14  6:45 AM  Result Value Ref Range   Cholesterol 166 0 - 200 mg/dL   Triglycerides 93 <956<150 mg/dL   HDL 59 >21>39 mg/dL   Total CHOL/HDL Ratio 2.8 RATIO   VLDL 19 0 - 40 mg/dL   LDL Cholesterol 88 0 - 99 mg/dL    Comment:        Total Cholesterol/HDL:CHD Risk Coronary Heart Disease Risk Table                     Men   Women  1/2 Average Risk   3.4   3.3  Average Risk       5.0   4.4  2 X Average Risk   9.6   7.1  3 X Average Risk  23.4   11.0        Use the calculated Patient Ratio above and the CHD Risk Table to determine the patient's CHD Risk.        ATP III CLASSIFICATION (LDL):  <100     mg/dL   Optimal  [id number]  mg/dL   Near or Above                    Optimal  130-159  mg/dL   Borderline  [id number]  mg/dL   High  >952>190     mg/dL   Very High Performed at [facility]   TSH     Status: Abnormal   Collection Time: 12/15/14  6:45 AM  Result Value Ref Range   TSH 5.292 (H) 0.350 - 4.500 uIU/mL    Comment: Performed at [facility]    Physical Findings: AIMS: Facial and Oral Movements Muscles of Facial Expression: None, normal Lips and Perioral Area: None, normal Jaw: None, normal Tongue: None, normal,Extremity Movements Upper (arms, wrists,  hands, fingers): None, normal Lower (legs, knees, ankles, toes): None, normal, Trunk Movements Neck, shoulders, hips: None, normal, Overall Severity Severity of abnormal movements (highest score from questions above): None, normal Incapacitation due to abnormal movements: None, normal Patient's awareness of abnormal movements (rate only patient's report): No Awareness, Dental Status Current problems with teeth and/or dentures?: No Does patient usually wear dentures?: No  CIWA:  CIWA-Ar Total: 2 COWS:     Treatment Plan Summary: Daily contact with patient to assess and evaluate symptoms and progress in treatment and Medication management Supportive approach/coping skills Alcohol dependence: continue the detox/work a relapse prevention plan Mood instability; will continue to optimize response to the psychotropics. Will increase the Neurontin to 400 mg QID ( He was taking 800 mg QID at one time) will reassess as we go along and have a target of the 800 mg QID that he used to take before We are going to continue the Risperdal 2 mg  HS We are going to reassure him that we are going to make sure he has the proper follow up once D/C Medical Decision Making:  Review of Psycho-Social Stressors (1), Review of Medication Regimen & Side Effects (2) and Review of New Medication or Change in Dosage (2)     Derrian Poli A 12/16/2014, 3:21 PM

## 2014-12-16 NOTE — Progress Notes (Signed)
D: Pt has depressed affect and mood.  Pt was isolative in his room at the beginning of the shift.  Pt reports "it's been a day."  When asked what his goal was, pt reports "I was trying to get stuff done on the phone but it ain't going too good, just where I'm gonna go, what I'm gonna do."  Pt denies SI/HI, denies hallucinations.  Pt was visible in the dayroom while peers were off unit at [place].  Pt reported chronic back pain and muscle spasms and reports Flexeril usually helps. A: Introduced self to pt.  Met with pt 1:1 and provided support and encouragement.  Actively listened to pt.  On-call provider notified of pt's complaints of back pain and muscle spasms. Medications administered per order.  PRN medication administered for muscle spasms, heat pack provided for back pain.  PO fluids encouraged and provided.   R: Pt is compliant with medications.  He is receptive to staff's interventions.  Pt verbally contracts for safety.  Will continue to monitor and assess.

## 2014-12-16 NOTE — Progress Notes (Signed)
D: Patient denies SI/HI and A/V hallucinations; patient reports sleep is good; reports appetite is fair; reports energy level is low ; ; rates depression as 7/10; rates hopelessness 5/10; rates anxiety as 5/10; reports "my goal is owning what I been thru and not blaming other"  A: Monitored q 15 minutes; patient encouraged to attend groups; patient educated about medications; patient given medications per physician orders; patient encouraged to express feelings and/or concerns  R: Patient is cooperative and pleasant; patient engages with peers more today; patient paces the hallway; patient's interaction with staff and peers is appropriate patient was able to set goal to talk with staff 1:1 when having feelings of SI; patient is taking medications as prescribed and tolerating medications; patient is attending all groups

## 2014-12-16 NOTE — Plan of Care (Signed)
Problem: Diagnosis: Increased Risk For Suicide Attempt Goal: STG-Patient Will Comply With Medication Regime Outcome: Progressing Pt has been compliant with all medications this shift.    Problem: Alteration in mood & ability to function due to Goal: LTG-Pt reports reduction in suicidal thoughts (Patient reports reduction in suicidal thoughts and is able to verbalize a safety plan for whenever patient is feeling suicidal)  Outcome: Progressing Pt denied SI this shift.  He verbally contracted for safety.

## 2014-12-17 DIAGNOSIS — F3130 Bipolar disorder, current episode depressed, mild or moderate severity, unspecified: Secondary | ICD-10-CM

## 2014-12-17 DIAGNOSIS — F1020 Alcohol dependence, uncomplicated: Secondary | ICD-10-CM

## 2014-12-17 MED ORDER — GABAPENTIN 300 MG PO CAPS
600.0000 mg | ORAL_CAPSULE | Freq: Four times a day (QID) | ORAL | Status: DC
  Administered 2014-12-17 – 2014-12-21 (×16): 600 mg via ORAL
  Filled 2014-12-17 (×25): qty 2

## 2014-12-17 MED ORDER — CYCLOBENZAPRINE HCL 10 MG PO TABS
10.0000 mg | ORAL_TABLET | Freq: Three times a day (TID) | ORAL | Status: DC | PRN
  Administered 2014-12-18 – 2014-12-21 (×6): 10 mg via ORAL
  Filled 2014-12-17 (×6): qty 1

## 2014-12-17 NOTE — Progress Notes (Signed)
Patient ID: William Perez, male   DOB: Aug 04, 1956, 59 y.o.   MRN: [id number] [facility] MD Progress Note  12/17/2014 3:53 PM William Perez  MRN:  [id number] Subjective:  Patient seen face-to-face for this evaluation today. Patient complains feeling anxiety, overwhelming feeding at the doomed, and also severe stated that he feels that something is going to happen bad and has racing thoughts. Patient is asking for medication to control his anxiety. Patient has been compliant with his medication and has no reported side effects.  Patient is having a hard time as there has been some conflict with his GF. States she has never seen him off medications as she did before he came here. He states he can turned into a really angry vicious person. He states he does not want to get to that point again. He states that while he was in [place] he had services that allowed him to stay on medications what kept him from relapsing. States that he came here with 3 months worth of medications. As he was running out he started to drop the dose to have some left.  He completley ran out what triggered his drinking.   Principal Problem: Bipolar I disorder, most recent episode depressed Diagnosis:   Patient Active Problem List   Diagnosis Date Noted  . PTSD (post-traumatic stress disorder) [F43.10] 12/15/2014  . Alcohol dependence [F10.20] 12/15/2014  . Bipolar I disorder, most recent episode depressed [F31.30] 12/15/2014   Total Time spent with patient: 30 minutes   Past Medical History:  Past Medical History  Diagnosis Date  . PTSD (post-traumatic stress disorder)   . Cirrhosis, alcoholic   . Acid reflux   . Degenerative arthritis of hip   . H/O: attempted suicide   . Depression   . Anxiety     Past Surgical History  Procedure Laterality Date  . Wrist surgery     Family History: History reviewed. No pertinent family history. Social History:  History  Alcohol Use  . 42.0 oz/week  . 70 Cans of beer per week     Comment: 6 to 12 beers daily depending on "state of mind"     History  Drug Use  . 4.00 per week  . Special: Marijuana    Comment: 2 to 3 times weekly    History   Social History  . Marital Status: Single    Spouse Name: N/A  . Number of Children: N/A  . Years of Education: N/A   Social History Main Topics  . Smoking status: Former Games developer  . Smokeless tobacco: Not on file  . Alcohol Use: 42.0 oz/week    70 Cans of beer per week     Comment: 6 to 12 beers daily depending on "state of mind"  . Drug Use: 4.00 per week    Special: Marijuana     Comment: 2 to 3 times weekly  . Sexual Activity: Yes    Birth Control/ Protection: None   Other Topics Concern  . None   Social History Narrative   Additional History:    Sleep: Fair  Appetite:  Fair   Assessment:   Musculoskeletal: Strength & Muscle Tone: within normal limits Gait & Station: normal Patient leans: N/A   Psychiatric Specialty Exam: Physical Exam  ROS  Blood pressure 118/80, pulse 102, temperature 97.4 F (36.3 C), temperature source Oral, resp. rate 20, height 5' 9.75" (1.772 m), weight 78.926 kg (174 lb).Body mass index is 25.14 kg/(m^2).  General Appearance: Fairly Groomed  Eye  Contact::  Fair  Speech:  Clear and Coherent  Volume:  fluctuates  Mood:  Anxious and worried  Affect:  Restricted  Thought Process:  Coherent and Goal Directed  Orientation:  Full (Time, Place, and Person)  Thought Content:  symptoms events worries concerns  Suicidal Thoughts:  No  Homicidal Thoughts:  No  Memory:  Immediate;   Fair Recent;   Fair Remote;   Fair  Judgement:  Fair  Insight:  Present  Psychomotor Activity:  Restlessness  Concentration:  Fair  Recall:  [name] of Knowledge:Fair  Language: Fair  Akathisia:  No  Handed:  Right  AIMS (if indicated):     Assets:  Desire for Improvement Housing Social Support  ADL's:  Intact  Cognition: WNL  Sleep:  Number of Hours: 6.75     Current  Medications: Current Facility-Administered Medications  Medication Dose Route Frequency Provider Last Rate Last Dose  . alum & mag hydroxide-simeth (MAALOX/MYLANTA) 200-200-20 MG/5ML suspension 30 mL  30 mL Oral Q4H PRN Kerry Hough, PA-C      . buPROPion (WELLBUTRIN XL) 24 hr tablet 150 mg  150 mg Oral Daily Adonis Brook, NP   150 mg at 12/17/14 0756  . cyclobenzaprine (FLEXERIL) tablet 10 mg  10 mg Oral TID PRN Sanjuana Kava, NP      . gabapentin (NEURONTIN) capsule 400 mg  400 mg Oral QID Rachael Fee, MD   400 mg at 12/17/14 1135  . hydrOXYzine (ATARAX/VISTARIL) tablet 25 mg  25 mg Oral Q6H PRN Kerry Hough, PA-C      . loperamide (IMODIUM) capsule 2-4 mg  2-4 mg Oral PRN Kerry Hough, PA-C      . LORazepam (ATIVAN) tablet 1 mg  1 mg Oral Q6H PRN Kerry Hough, PA-C      . LORazepam (ATIVAN) tablet 1 mg  1 mg Oral BID Kerry Hough, PA-C   1 mg at 12/17/14 0756   Followed by  . [START ON 12/18/2014] LORazepam (ATIVAN) tablet 1 mg  1 mg Oral Daily Spencer E Simon, PA-C      . magnesium hydroxide (MILK OF MAGNESIA) suspension 30 mL  30 mL Oral Daily PRN Kerry Hough, PA-C      . multivitamin with minerals tablet 1 tablet  1 tablet Oral Daily Kerry Hough, PA-C   1 tablet at 12/17/14 0756  . ondansetron (ZOFRAN-ODT) disintegrating tablet 4 mg  4 mg Oral Q6H PRN Kerry Hough, PA-C      . pantoprazole (PROTONIX) EC tablet 40 mg  40 mg Oral BID AC Adonis Brook, NP   40 mg at 12/17/14 0651  . risperiDONE (RISPERDAL) tablet 2 mg  2 mg Oral QHS Adonis Brook, NP   2 mg at 12/16/14 2145  . thiamine (B-1) injection 100 mg  100 mg Intramuscular Once Kerry Hough, PA-C   100 mg at 12/15/14 9629  . thiamine (VITAMIN B-1) tablet 100 mg  100 mg Oral Daily Kerry Hough, PA-C   100 mg at 12/17/14 0756  . traZODone (DESYREL) tablet 50 mg  50 mg Oral QHS,MR X 1 Kerry Hough, PA-C   50 mg at 12/16/14 2145    Lab Results:  No results found for this or any previous visit (from  the past 48 hour(s)).  Physical Findings: AIMS: Facial and Oral Movements Muscles of Facial Expression: None, normal Lips and Perioral Area: None, normal Jaw: None, normal Tongue: None, normal,Extremity  Movements Upper (arms, wrists, hands, fingers): None, normal Lower (legs, knees, ankles, toes): None, normal, Trunk Movements Neck, shoulders, hips: None, normal, Overall Severity Severity of abnormal movements (highest score from questions above): None, normal Incapacitation due to abnormal movements: None, normal Patient's awareness of abnormal movements (rate only patient's report): No Awareness, Dental Status Current problems with teeth and/or dentures?: No Does patient usually wear dentures?: No  CIWA:  CIWA-Ar Total: 0 COWS:     Treatment Plan Summary: Daily contact with patient to assess and evaluate symptoms and progress in treatment and Medication management Supportive approach/coping skills  Alcohol dependence: continue the detox/work a relapse prevention plan Mood instability; will continue to optimize response to the psychotropics.  Will increase the Neurontin to 600 mg QID ( He was taking 800 mg QID at one time) will reassess as we go along and have a target of the 800 mg QID that he used to take before We are going to continue the Risperdal 2 mg HS and monitor for the extrapyramidal symptoms, if needed add benztropine 0.5 mg twice daily We are going to reassure him that we are going to make sure he has the proper follow up once D/C  Medical Decision Making:  Review of Psycho-Social Stressors (1), Review of Medication Regimen & Side Effects (2) and Review of New Medication or Change in Dosage (2)     Dianne Whelchel,JANARDHAHA R. 12/17/2014, 3:53 PM

## 2014-12-17 NOTE — [facility] Group Notes (Signed)
[facility] Group Notes:  (Clinical Social Work)  12/17/2014     10-11AM  Summary of Progress/Problems:   The main focus of today's process group was to learn how to use a decisional balance exercise to move forward in the Stages of Change, which were described and discussed.  Motivational Interviewing and a worksheet were utilized to help patients explore in depth the perceived benefits and costs of unhealthy coping techniques, as well as the  benefits and costs of replacing that with a healthy coping skills.   The patient expressed that their unhealthy coping involves violent outbursts which come out of nowhere and he cannot control, and drinking alcohol to calm himself down.  He was intrusive at times but redirectable.  Type of Therapy:  Group Therapy - Process   Participation Level:  Active  Participation Quality:  Attentive and Monopolizing  Affect:  Anxious and Blunted  Cognitive:  Appropriate and Oriented  Insight:  Improving  Engagement in Therapy:  Engaged  Modes of Intervention:  Education, Motivational Interviewing  [name], LCSW 12/17/2014, 12:57 PM

## 2014-12-17 NOTE — Progress Notes (Signed)
Patient ID: Suan HalterDale Belisle, male   DOB: [birth year], 59 y.o.   MRN: [id number] D)  Was out and about on the hall this evening, attended group.  While in group, MHT received a bag of belongings for pt, to be searched before being given to the pt.  When he came to the desk after group, was told he could not have a large handkerchief found in his belongings.  Pt became angry with the tech and said he had been allowed to have a handkerchief, and pulled up his shirt to reveal he was wearing one tied halfway around his waist, to hold up his pants.  MHT asked him for that handkerchief and he refused.  Incident was reported to this RN.  After giving pt his meds and trying to establish rapport,  Informed pt that the handkerchief was considered contraband and would need to take it off so that it could be locked up, that it would be returned on discharge, but pt became very angry and began cursing staff, yelling and offering to take off all of his clothes, saying he could hang himself with his pants or his shirt, his bed sheets, etc.  Walking up and down the hall making derogatory statements about staff, [facility], etc.  Eventually went to his room and went to bed when staff didn't engage. A)  Will continue to monitor for safety, continue POC R)  Safety maintained.

## 2014-12-17 NOTE — [facility] Group Notes (Signed)
Adult Psychoeducational Group Note  Date:  12/17/2014 Time:  10:44 AM  Group Topic/Focus:  Health Coping Skills  Participation Level:  Active  Participation Quality:  Intrusive  Affect:  Anxious  Cognitive:  Alert  Insight: Lacking  Engagement in Group:  Engaged  Modes of Intervention:  Clarification and Discussion  Additional Comments:   Barton Fannyyson, Meyer Dockery M   12/17/2014, 10:44 AM

## 2014-12-17 NOTE — Progress Notes (Signed)
D: Patient has been calm with pleasant mood today.  He asked for his flexeril and discussed the possibility of raising the dosage to 10 mg.  Patient stated that he jumped from a bridge in a suicide attempt a few years ago.  He showed this nurse how his shoulders are uneven.  Patient reports withdrawal symptoms of agitation; denies any tremors or nausea.  He denies SI/HI/AVH.  He rates his depression and anxiety as a 6; hopelessness as a 5.  He hopes that he and his fiance are able to attend NAMI meetings in this area.   A: Continue to monitor medication management and MD orders.  Safety checks completed every 15 minutes per protocol.  Meet 1:1 with patient to discuss concerns and offer encouragement. R: Patient's behavior is appropriate to situation.

## 2014-12-18 NOTE — [facility] Group Notes (Signed)
[facility] Group Notes:  (Clinical Social Work)  12/18/2014  10:00-11:00AM  Summary of Progress/Problems:   The main focus of today's process group was to   1)  discuss the importance of adding supports  2)  define health supports versus unhealthy supports  3)  identify the patient's current unhealthy supports and plan how to handle them  4)  Identify the patient's current healthy supports and plan what to add.  An emphasis was placed on using counselor, doctor, therapy groups, 12-step groups, and problem-specific support groups to expand supports.    The patient expressed full comprehension of the concepts presented, and agreed that there is a need to add more supports.  The patient stated his fiancee is his healthy support because she does not drink, does not like for him to drink.  He stated [place] with all its microbreweries and bars is his unhealthy support.  He mentioned that [place] had [place], and had just been restarted with an ACT Team prior to his move here, and that was the most successful he has ever been.  Type of Therapy:  Process Group with Motivational Interviewing  Participation Level:  Active  Participation Quality:  Appropriate, Attentive, Sharing and Supportive  Affect:  Appropriate and Blunted  Cognitive:  Alert and Appropriate  Insight:  Engaged  Engagement in Therapy:  Engaged  Modes of Intervention:   Education, Support and Processing, Activity  William MantleMareida Grossman-Orr, LCSW 12/18/2014

## 2014-12-18 NOTE — Plan of Care (Signed)
Problem: Consults Goal: Depression Patient Education See Patient Education Module for education specifics.  Outcome: Completed/Met Date Met:  12/18/14 Nurse discussed depression/coping skills with patient.     

## 2014-12-18 NOTE — Progress Notes (Signed)
D: Patient reports poor sleep; good appetite. He reports that his depressive symptoms have decreased.  He rates his depression and hopelessness as a 4; anxiety as a 6.  His goal for today is to "think calm" and work on his "anger outbursts."  He has been pleasant; his mood brighter.  He denies any SI/HI/AVH.  Patient is attending groups and participating. Patient  A: Continue to monitor medication management and MD orders.  Safety checks completed every 15 minutes per protocol.  Meet 1:1 with patient to discuss concerns and offer encouragement. R: Patient's behavior is appropriate to situation.

## 2014-12-18 NOTE — Progress Notes (Signed)
Patient ID: Suan HalterDale Eberly, male   DOB: Aug 30, 1955, 59 y.o.   MRN: [id number] Patient ID: Suan HalterDale Guerrero, male   DOB: Aug 30, 1955, 59 y.o.   [id number] [facility]  12/18/2014 1:44 PM Suan HalterDale Fergusson  MRN:  [id number]   Subjective:  Patient complain feeling irritable and angry during this evaluation. He is also feeling anxiety, overwhelming, doomed, and also severe stated that he feels that something is going to happen bad and has racing thoughts. Patient has been compliant with his medication and has no reported side effects. Patient stated that he is feeling better since his girlfriend visited him and that able to resolve some of the conflicts. Patient rated his depression as 4 out of 10 and anxiety 7 out of 10 and contract for safety and minimizes his safety concerns at this time.  Principal Problem: Bipolar I disorder, most recent episode depressed Diagnosis:   Patient Active Problem List   Diagnosis Date Noted  . PTSD (post-traumatic stress disorder) [F43.10] 12/15/2014  . Alcohol dependence [F10.20] 12/15/2014  . Bipolar I disorder, most recent episode depressed [F31.30] 12/15/2014   Total Time spent with patient: 20 minutes   Past Medical History:  Past Medical History  Diagnosis Date  . PTSD (post-traumatic stress disorder)   . Cirrhosis, alcoholic   . Acid reflux   . Degenerative arthritis of hip   . H/O: attempted suicide   . Depression   . Anxiety     Past Surgical History  Procedure Laterality Date  . Wrist surgery     Family History: History reviewed. No pertinent family history. Social History:  History  Alcohol Use  . 42.0 oz/week  . 70 Cans of beer per week    Comment: 6 to 12 beers daily depending on "state of mind"     History  Drug Use  . 4.00 per week  . Special: Marijuana    Comment: 2 to 3 times weekly    History   Social History  . Marital Status: Single    Spouse Name: N/A  . Number of Children: N/A  . Years of Education: N/A    Social History Main Topics  . Smoking status: Former Games developermoker  . Smokeless tobacco: Not on file  . Alcohol Use: 42.0 oz/week    70 Cans of beer per week     Comment: 6 to 12 beers daily depending on "state of mind"  . Drug Use: 4.00 per week    Special: Marijuana     Comment: 2 to 3 times weekly  . Sexual Activity: Yes    Birth Control/ Protection: None   Other Topics Concern  . None   Social History Narrative   Additional History:    Sleep: Fair  Appetite:  Fair   Assessment:   Musculoskeletal: Strength & Muscle Tone: within normal limits Gait & Station: normal Patient leans: N/A   Psychiatric Specialty Exam: Physical Exam  ROS  Blood pressure 111/88, pulse 78, temperature 97.3 F (36.3 C), temperature source Oral, resp. rate 20, height 5' 9.75" (1.772 m), weight 78.926 kg (174 lb).Body mass index is 25.14 kg/(m^2).  General Appearance: Fairly Groomed  Patent attorneyye Contact::  Fair  Speech:  Clear and Coherent  Volume:  fluctuates  Mood:  Anxious and worried  Affect:  Restricted  Thought Process:  Coherent and Goal Directed  Orientation:  Full (Time, Place, and Person)  Thought Content:  symptoms events worries concerns  Suicidal Thoughts:  No  Homicidal Thoughts:  No  Memory:  Immediate;   Fair Recent;   Fair Remote;   Fair  Judgement:  Fair  Insight:  Present  Psychomotor Activity:  Restlessness  Concentration:  Fair  Recall:  [name] of Knowledge:Fair  Language: Fair  Akathisia:  No  Handed:  Right  AIMS (if indicated):     Assets:  Desire for Improvement Housing Social Support  ADL's:  Intact  Cognition: WNL  Sleep:  Number of Hours: 6.25     Current Medications: Current Facility-Administered Medications  Medication Dose Route Frequency Provider Last Rate Last Dose  . alum & mag hydroxide-simeth (MAALOX/MYLANTA) 200-200-20 MG/5ML suspension 30 mL  30 mL Oral Q4H PRN Kerry Hough, PA-C      . buPROPion (WELLBUTRIN XL) 24 hr tablet 150 mg   150 mg Oral Daily Adonis Brook, NP   150 mg at 12/18/14 0744  . cyclobenzaprine (FLEXERIL) tablet 10 mg  10 mg Oral TID PRN Sanjuana Kava, NP      . gabapentin (NEURONTIN) capsule 600 mg  600 mg Oral QID Leata Mouse, MD   600 mg at 12/18/14 1126  . magnesium hydroxide (MILK OF MAGNESIA) suspension 30 mL  30 mL Oral Daily PRN Kerry Hough, PA-C      . multivitamin with minerals tablet 1 tablet  1 tablet Oral Daily Kerry Hough, PA-C   1 tablet at 12/18/14 0744  . pantoprazole (PROTONIX) EC tablet 40 mg  40 mg Oral BID AC Adonis Brook, NP   40 mg at 12/18/14 4098  . risperiDONE (RISPERDAL) tablet 2 mg  2 mg Oral QHS Adonis Brook, NP   2 mg at 12/18/14 0136  . thiamine (B-1) injection 100 mg  100 mg Intramuscular Once Kerry Hough, PA-C   100 mg at 12/15/14 1191  . thiamine (VITAMIN B-1) tablet 100 mg  100 mg Oral Daily Kerry Hough, PA-C   100 mg at 12/18/14 0744  . traZODone (DESYREL) tablet 50 mg  50 mg Oral QHS,MR X 1 Spencer E Simon, PA-C   50 mg at 12/18/14 4782    Lab Results:  No results found for this or any previous visit (from the past 48 hour(s)).  Physical Findings: AIMS: Facial and Oral Movements Muscles of Facial Expression: None, normal Lips and Perioral Area: None, normal Jaw: None, normal Tongue: None, normal,Extremity Movements Upper (arms, wrists, hands, fingers): None, normal Lower (legs, knees, ankles, toes): None, normal, Trunk Movements Neck, shoulders, hips: None, normal, Overall Severity Severity of abnormal movements (highest score from questions above): None, normal Incapacitation due to abnormal movements: None, normal Patient's awareness of abnormal movements (rate only patient's report): No Awareness, Dental Status Current problems with teeth and/or dentures?: No Does patient usually wear dentures?: No  CIWA:  CIWA-Ar Total: 0 COWS:     Treatment Plan Summary: Daily contact with patient to assess and evaluate symptoms and  progress in treatment and Medication management Supportive approach/coping skills  Alcohol dependence: continue the detox/work a relapse prevention plan Mood instability; will continue to optimize response to the psychotropics.  Continue Neurontin to 600 mg QID ( He was taking 800 mg QID at one time) will reassess as we go along and have a target of the 800 mg QID that he used to take before Continue the Risperdal 2 mg HS and monitor for the extrapyramidal symptoms, if needed add benztropine 0.5 mg twice daily We are going to reassure him that we are going to make sure he  has the proper follow up once D/C  Medical Decision Making:  Review of Psycho-Social Stressors (1), Review of Medication Regimen & Side Effects (2) and Review of New Medication or Change in Dosage (2)     Dalynn Jhaveri,JANARDHAHA R. 12/18/2014, 1:44 PM

## 2014-12-18 NOTE — [facility] Group Notes (Signed)
[facility] Group Notes:  (Nursing/MHT/Case Management/Adjunct)  Date:  12/18/2014  Time:  0915  Type of Therapy:  Psychoeducational Skills  Participation Level:  Active  Participation Quality:  Appropriate and Attentive  Affect:  Appropriate  Cognitive:  Alert and Appropriate  Insight:  Good  Engagement in Group:  Engaged  Modes of Intervention:  Support  Summary of Progress/Problems: Patient actively participating.  He is supportive and engaged with his peers.  Cranford MonBeaudry, Chris Narasimhan Evans 12/18/2014, 10:21 AM

## 2014-12-18 NOTE — Progress Notes (Signed)
Patient ID: William Perez, male   DOB: [birth year], 59 y.o.   MRN: [id number] D)  Pt had a visit from his GF this evening, remains irritable.  Attended group this evening, mht reported that pt had to be redirected several times as he became focused on not being allowed to keep his bandana, episode written about last night, and went off on tirades.  Came to the med window after group but when this writer started to approach him to scan his armband, he walked away, ignored writer's attempts to call back, was angry and making more derogatory comments..  Didn't come back for meds, went to bed and went to sleep.  Came out to the nursing station this morning around 01:30 and asked for hs meds, stated he had fallen asleep and had missed his meds, acted like he was unaware of intentional behavior earlier .  Was very friendly and spoke nicely, took meds and went back to his room. A)  Will continue to monitor for safety, continue POC R)  Safety maintained.

## 2014-12-18 NOTE — Plan of Care (Signed)
Problem: Alteration in mood Goal: LTG-Pt's behavior demonstrates decreased signs of depression Pt rates depression as high and presents with depressed mood/irritable affect. By d/c, pt will rate depression as low (or as a 3 or less) and pt presentation will correspond to self report. Goal not met. Pierce 12/15/2014 1:06 PM  Outcome: Progressing Patient reports decrease in depressive symptoms.

## 2014-12-18 NOTE — Progress Notes (Signed)
Pt attended AA group with guest speaker @ 20:00 

## 2014-12-19 MED ORDER — DIVALPROEX SODIUM ER 250 MG PO TB24
250.0000 mg | ORAL_TABLET | Freq: Two times a day (BID) | ORAL | Status: DC
  Administered 2014-12-19 – 2014-12-21 (×5): 250 mg via ORAL
  Filled 2014-12-19 (×9): qty 1
  Filled 2014-12-19 (×2): qty 42

## 2014-12-19 NOTE — Progress Notes (Addendum)
D: Pt reports to be having a good day today. Pt was minimal and verbalized that he would alert staff if he needed anything. Pt was calm during our interaction. He denied any SI/HI/AVH. Pt was present for group. Pt is compliant with his current POC. A: Writer administered scheduled and prnmedications to pt, per MD orders. Continued support and availability as needed was extended to this pt. Staff continue to monitor pt with q3915min checks.  R: No adverse drug reactions noted. Pt receptive to treatment. Pt remains safe at this time.

## 2014-12-19 NOTE — Progress Notes (Signed)
  [facility] :  Will you be returning to the same living situation after discharge:  No, patient plans to stay at a hotel or camp out in the woods  At discharge, do you have transportation home?: Yes,  Patient will be provided PART bus [place] Do you have the ability to pay for your medications: Yes,  Patient will be provided with medication samples and prescriptions at discharge  Release of information consent forms completed and in the chart;  Patient's signature needed at discharge.  Patient to Follow up at: Follow-up Information    [facility] up with [facility] On 12/21/2014.   Why:  Assessment for therapy, medication management, and ACTT team services on Wednesday May 4th at 9 am with West PughHannah Trone. Please bring your ID and insurance card.    Contact information:   [address] [place], [place] [address] Phone: [phone] Fax: [phone]      Patient denies SI/HI: Yes,  denies    Safety Planning and Suicide Prevention discussed: Yes,  with patient  Have you used any form of tobacco in the last 30 days? (Cigarettes, Smokeless Tobacco, Cigars, and/or Pipes): No  Has patient been referred to the Quitline?: N/A patient is not a smoker  Shain Pauwels L 12/19/2014, 11:15 AM

## 2014-12-19 NOTE — [facility] Group Notes (Signed)
[facility] LCSW Group Therapy 12/19/2014  1:15 pm  Type of Therapy: Group Therapy Participation Level: Active  Participation Quality: Attentive, Sharing and Supportive  Affect: Appropriate  Cognitive: Alert and Oriented  Insight: Developing/Improving and Engaged  Engagement in Therapy: Developing/Improving and Engaged  Modes of Intervention: Clarification, Confrontation, Discussion, Education, Exploration,  Limit-setting, Orientation, Problem-solving, Rapport Building, Dance movement psychotherapisteality Testing, Socialization and Support  Summary of Progress/Problems: Pt identified obstacles faced currently and processed barriers involved in overcoming these obstacles. Pt identified steps necessary for overcoming these obstacles and explored motivation (internal and external) for facing these difficulties head on. Pt further identified one area of concern in their lives and chose a goal to focus on for today. Patient identified difficulty controlling his anger and impulsiveness as obstacles and discussed the negative consequences of his anger including losing relationships. He reports that getting established with an ACTT team and engaging in anger management classes would be beneficial. Suggestions from other group members included exercising as a coping skill. Patient provided other group members with emotional support and encouragement such as making family a priority in life.  Samuella BruinKristin Malayah Demuro, MSW, [employer]CSWA Clinical Social Worker [facility] [phone]

## 2014-12-19 NOTE — Progress Notes (Signed)
D: Patient is anxious and worried about discharge today.  He states, "I've got to do something about this anger."  "I can't leave like this."  Patient is more subdued today; not as outgoing and bright as yesterday.  He c/o back pain.  States the flexaril and stretching exercises help.  Patient has also expressed his desire to get set up with an ACT team here.  He denies SI/HI/AVH.  He is interacting well with staff and peers.  He is attending groups and participating in his treatment. A: Continue to monitor medication management and MD orders. Safety checks completed every 15 minutes per protocol.  Meet 1:1 with patient to discuss concerns and offer encouragement. R: Patient's behavior is appropriate to situation.

## 2014-12-19 NOTE — [facility] Group Notes (Signed)
   [facility]  12/19/2014  8:45 AM   Participation Quality: Alert, Appropriate and Oriented  Mood/Affect: Anxious  Depression Rating: 4  Anxiety Rating: 8  Thoughts of Suicide: Pt denies SI/HI  Will you contract for safety? Yes  Current AVH: Pt denies  Plan for Discharge/Comments: Pt attended discharge planning group and actively participated in group. CSW provided pt with today's workbook. [place] and reports that he will stay in a motel or camp in the woods. Patient has declined to stay in a shelter. He is requesting transportation assistance and referral for outpatient services. He would [place] a [place].   Transportation Means: Continuing to assess, patient [place]   Supports: No supports mentioned at this time  Samuella BruinKristin Isidor Bromell, MSW, [employer]CSWA Clinical Social Worker [facility] [phone]

## 2014-12-19 NOTE — [facility] Suicide Risk Assessment (Signed)
[facility] INPATIENT:  Family/Significant Other Suicide Prevention Education  Suicide Prevention Education:  Patient Refusal for Family/Significant Other Suicide Prevention Education: The patient William Perez has requested that girlfriend Felicity PellegriniSandra Ward [phone] be contacted regarding his hospitalization and SPE be reviewed with. However, patient informed CSW that girlfriend has a restraining order/50-B out against him currently so CSW cannot make contact at this time. SPE reviewed with patient and brochure provided. Patient encouraged to return to hospital if having suicidal thoughts, patient verbalized his/her understanding and has no further questions at this time.   Dennison Mcdaid, West CarboKristin L 12/19/2014, 11:08 AM

## 2014-12-19 NOTE — Progress Notes (Signed)
[facility] MD Progress Note  12/19/2014 3:57 PM William Perez Perez  MRN:  [id number] Subjective:  William Perez is concerned about his mood swings, his anger. He states that they are not alcohol related. Looking back even when he was not drinking on his current medication regime he was experiencing them and admits he is afraid of himself and what he can do when that anger builds up. States it has interfered with relationships in the past. His actual GF got a 50 B against him and he is very upset that she felt so threatened so scared of him. He states he wants to pursue a relationship with her as she was a HS sweat who came back in his life and he wants it to work. Principal Problem: Bipolar I disorder, most recent episode depressed Diagnosis:   Patient Active Problem List   Diagnosis Date Noted  . PTSD (post-traumatic stress disorder) [F43.10] 12/15/2014  . Alcohol dependence [F10.20] 12/15/2014  . Bipolar I disorder, most recent episode depressed [F31.30] 12/15/2014   Total Time spent with patient: 30 minutes   Past Medical History:  Past Medical History  Diagnosis Date  . PTSD (post-traumatic stress disorder)   . Cirrhosis, alcoholic   . Acid reflux   . Degenerative arthritis of hip   . H/O: attempted suicide   . Depression   . Anxiety     Past Surgical History  Procedure Laterality Date  . Wrist surgery     Family History: History reviewed. No pertinent family history. Social History:  History  Alcohol Use  . 42.0 oz/week  . 70 Cans of beer per week    Comment: 6 to 12 beers daily depending on "state of mind"     History  Drug Use  . 4.00 per week  . Special: Marijuana    Comment: 2 to 3 times weekly    History   Social History  . Marital Status: Single    Spouse Name: N/A  . Number of Children: N/A  . Years of Education: N/A   Social History Main Topics  . Smoking status: Former Games developer  . Smokeless tobacco: Not on file  . Alcohol Use: 42.0 oz/week    70 Cans of beer per week      Comment: 6 to 12 beers daily depending on "state of mind"  . Drug Use: 4.00 per week    Special: Marijuana     Comment: 2 to 3 times weekly  . Sexual Activity: Yes    Birth Control/ Protection: None   Other Topics Concern  . None   Social History Narrative   Additional History:    Sleep: Fair  Appetite:  Fair   Assessment:   Musculoskeletal: Strength & Muscle Tone: within normal limits Gait & Station: normal Patient leans: N/A   Psychiatric Specialty Exam: Physical Exam  Review of Systems  Constitutional: Negative.   HENT: Negative.   Eyes: Negative.   Respiratory: Negative.   Cardiovascular: Negative.   Gastrointestinal: Negative.   Genitourinary: Negative.   Musculoskeletal: Negative.   Skin: Negative.   Neurological: Negative.   Endo/Heme/Allergies: Negative.   Psychiatric/Behavioral: Positive for depression and substance abuse. The patient is nervous/anxious.        Anger/mood instability    Blood pressure 117/75, pulse 87, temperature 97.5 F (36.4 C), temperature source Oral, resp. rate 18, height 5' 9.75" (1.772 m), weight 78.926 kg (174 lb).Body mass index is 25.14 kg/(m^2).  General Appearance: Fairly Groomed  Patent attorney::  Fair  Speech:  Clear and Coherent  Volume:  Normal  Mood:  Anxious, Depressed, Irritable and worried  Affect:  anxious worried  Thought Process:  Coherent and Goal Directed  Orientation:  Full (Time, Place, and Person)  Thought Content:  symptoms events worries concerns fear of losing control  Suicidal Thoughts:  No  Homicidal Thoughts:  No  Memory:  Immediate;   Fair Recent;   Fair Remote;   Fair  Judgement:  Fair  Insight:  Present and Shallow  Psychomotor Activity:  Restlessness  Concentration:  Fair  Recall:  [name] of Knowledge:Fair  Language: Fair  Akathisia:  No  Handed:  Right  AIMS (if indicated):     Assets:  Desire for Improvement  ADL's:  Intact  Cognition: WNL  Sleep:  Number of Hours: 6.25      Current Medications: Current Facility-Administered Medications  Medication Dose Route Frequency Provider Last Rate Last Dose  . alum & mag hydroxide-simeth (MAALOX/MYLANTA) 200-200-20 MG/5ML suspension 30 mL  30 mL Oral Q4H PRN Kerry Hough, PA-C      . buPROPion (WELLBUTRIN XL) 24 hr tablet 150 mg  150 mg Oral Daily Adonis Brook, NP   150 mg at 12/19/14 0737  . cyclobenzaprine (FLEXERIL) tablet 10 mg  10 mg Oral TID PRN Sanjuana Kava, NP   10 mg at 12/19/14 1610  . divalproex (DEPAKOTE ER) 24 hr tablet 250 mg  250 mg Oral BID Rachael Fee, MD   250 mg at 12/19/14 1202  . gabapentin (NEURONTIN) capsule 600 mg  600 mg Oral QID Leata Mouse, MD   600 mg at 12/19/14 1202  . magnesium hydroxide (MILK OF MAGNESIA) suspension 30 mL  30 mL Oral Daily PRN Kerry Hough, PA-C      . multivitamin with minerals tablet 1 tablet  1 tablet Oral Daily Kerry Hough, PA-C   1 tablet at 12/19/14 0737  . pantoprazole (PROTONIX) EC tablet 40 mg  40 mg Oral BID AC Adonis Brook, NP   40 mg at 12/19/14 9604  . risperiDONE (RISPERDAL) tablet 2 mg  2 mg Oral QHS Adonis Brook, NP   2 mg at 12/18/14 2213  . thiamine (B-1) injection 100 mg  100 mg Intramuscular Once Kerry Hough, PA-C   100 mg at 12/15/14 5409  . thiamine (VITAMIN B-1) tablet 100 mg  100 mg Oral Daily Kerry Hough, PA-C   100 mg at 12/19/14 8119  . traZODone (DESYREL) tablet 50 mg  50 mg Oral QHS,MR X 1 Spencer E Simon, PA-C   50 mg at 12/18/14 2300    Lab Results: No results found for this or any previous visit (from the past 48 hour(s)).  Physical Findings: AIMS: Facial and Oral Movements Muscles of Facial Expression: None, normal Lips and Perioral Area: None, normal Jaw: None, normal Tongue: None, normal,Extremity Movements Upper (arms, wrists, hands, fingers): None, normal Lower (legs, knees, ankles, toes): None, normal, Trunk Movements Neck, shoulders, hips: None, normal, Overall Severity Severity of  abnormal movements (highest score from questions above): None, normal Incapacitation due to abnormal movements: None, normal Patient's awareness of abnormal movements (rate only patient's report): No Awareness, Dental Status Current problems with teeth and/or dentures?: No Does patient usually wear dentures?: No  CIWA:  CIWA-Ar Total: 0 COWS:     Treatment Plan Summary: Daily contact with patient to assess and evaluate symptoms and progress in treatment and Medication management Supportive approach/coping skills Alcohol dependence; work a relapse prevention  plan Mood instability; will start Depakote 250 BID to address the mood fluctuations and the anger Will work with anger management strategies Hallucinations; states that the Risperdal keeps the voices under control will pursue further CBT/mindfulness   Medical Decision Making:  Review of Psycho-Social Stressors (1), Review or order clinical lab tests (1), Review of Medication Regimen & Side Effects (2) and Review of New Medication or Change in Dosage (2)     Omarr Hann A 12/19/2014, 3:57 PM

## 2014-12-19 NOTE — Progress Notes (Signed)
Pt reports his day has improved as it progressed.  He states he may be discharged tomorrow, but he is not sure.  He hopes to get set up with an ACTT, because he says he does better if he has that support.  He plans to stay with a friend at discharge.  He says his withdrawal symptoms are minimal.  He denies SI/HI/AVH to this Clinical research associatewriter.   He voices not needs or complaints and says his meds are working for him.  Support and encouragement offered.  Safety maintained with q15 minute checks.

## 2014-12-19 NOTE — Progress Notes (Signed)
Recreation Therapy Notes  Date: 05.02.2016 Time: 9:30am Location: [address] Group Room   Group Topic: Stress Management  Goal Area(s) Addresses:  Patient will actively participate in stress management techniques presented during session.   Behavioral Response: Did not attend.     Victorino Fatzinger L Kemarion Abbey, LRT/CTRS  Jayvier Burgher L 12/19/2014 4:56 PM 

## 2014-12-20 MED ORDER — DIVALPROEX SODIUM ER 250 MG PO TB24
250.0000 mg | ORAL_TABLET | Freq: Every day | ORAL | Status: DC
  Administered 2014-12-20: 250 mg via ORAL
  Filled 2014-12-20 (×3): qty 1

## 2014-12-20 MED ORDER — LIDOCAINE 5 % EX PTCH
1.0000 | MEDICATED_PATCH | Freq: Every day | CUTANEOUS | Status: DC
  Administered 2014-12-20 – 2014-12-21 (×2): 1 via TRANSDERMAL
  Filled 2014-12-20 (×5): qty 1

## 2014-12-20 NOTE — Progress Notes (Signed)
Patient ID: William Perez, male   DOB: 1955/11/05, 59 y.o.   MRN: [id number] D: Patient reports current medication regime has been effective because he has had no anger outburst and has time to think before reacting. Pt reports goal is to continue working on his anger issues. Pt reports he need to be better husband for fiancee.  Pt reports hope to discharge tomorrow pending depakote levels. Pt denies SI/HI/AVH. Pt attended evening wrap up group and engage in discussion. Cooperative with assessment.   A: Met with pt 1:1. Medications administered as prescribed. Writer encouraged pt to discuss feelings. Pt encouraged to come to staff with any question or concerns.   R: Patient remains safe. He is complaint with medications and denies any adverse reaction.

## 2014-12-20 NOTE — Progress Notes (Signed)
[facility]  12/20/2014 5:02 PM William HalterDale Perez  MRN:  [id number] Subjective:  William JupiterDale expressed concern about his drastic mood fluctuations, his anger spells. He was started on Depakote. He has tolerated the Depakote well so far. He heard from his GF that she is trying to get the restraining order canceled so he can get to go back home. States they are still going to be together. He still expressed concerns about being able to get the right outpatient services here as good as the ACT team he had in [place]. He states he is committed to make it work as does not want to ruin his chances with his GF Principal Problem: Bipolar I disorder, most recent episode depressed Diagnosis:   Patient Active Problem List   Diagnosis Date Noted  . PTSD (post-traumatic stress disorder) [F43.10] 12/15/2014  . Alcohol dependence [F10.20] 12/15/2014  . Bipolar I disorder, most recent episode depressed [F31.30] 12/15/2014   Total Time spent with patient: 30 minutes   Past Medical History:  Past Medical History  Diagnosis Date  . PTSD (post-traumatic stress disorder)   . Cirrhosis, alcoholic   . Acid reflux   . Degenerative arthritis of hip   . H/O: attempted suicide   . Depression   . Anxiety     Past Surgical History  Procedure Laterality Date  . Wrist surgery     Family History: History reviewed. No pertinent family history. Social History:  History  Alcohol Use  . 42.0 oz/week  . 70 Cans of beer per week    Comment: 6 to 12 beers daily depending on "state of mind"     History  Drug Use  . 4.00 per week  . Special: Marijuana    Comment: 2 to 3 times weekly    History   Social History  . Marital Status: Single    Spouse Name: N/A  . Number of Children: N/A  . Years of Education: N/A   Social History Main Topics  . Smoking status: Former Games developermoker  . Smokeless tobacco: Not on file  . Alcohol Use: 42.0 oz/week    70 Cans of beer per week     Comment: 6 to 12 beers daily depending  on "state of mind"  . Drug Use: 4.00 per week    Special: Marijuana     Comment: 2 to 3 times weekly  . Sexual Activity: Yes    Birth Control/ Protection: None   Other Topics Concern  . None   Social History Narrative   Additional History:    Sleep: Fair  Appetite:  Fair   Assessment:   Musculoskeletal: Strength & Muscle Tone: within normal limits Gait & Station: normal Patient leans: N/A   Psychiatric Specialty Exam: Physical Exam  Review of Systems  Constitutional: Negative.   HENT: Negative.   Eyes: Negative.   Respiratory: Negative.   Cardiovascular: Negative.   Gastrointestinal: Negative.   Genitourinary: Negative.   Musculoskeletal: Negative.   Skin: Negative.   Neurological: Negative.   Endo/Heme/Allergies: Negative.   Psychiatric/Behavioral: Positive for depression and substance abuse. The patient is nervous/anxious.     Blood pressure 100/71, pulse 78, temperature 98.2 F (36.8 C), temperature source Oral, resp. rate 18, height 5' 9.75" (1.772 m), weight 78.926 kg (174 lb).Body mass index is 25.14 kg/(m^2).  General Appearance: Fairly Groomed  Patent attorneyye Contact::  Fair  Speech:  Clear and Coherent  Volume:  fluctuates  Mood:  Anxious and worried  Affect:  anxious worried  Thought Process:  Coherent and Goal Directed  Orientation:  Full (Time, Place, and Person)  Thought Content:  symptoms events worries concerns  Suicidal Thoughts:  No  Homicidal Thoughts:  No  Memory:  Immediate;   Fair Recent;   Fair Remote;   Fair  Judgement:  Fair  Insight:  Present  Psychomotor Activity:  Restlessness  Concentration:  Fair  Recall:  [name] of Knowledge:Fair  Language: Fair  Akathisia:  No  Handed:  Right  AIMS (if indicated):     Assets:  Desire for Improvement Housing Social Support  ADL's:  Intact  Cognition: WNL  Sleep:  Number of Hours: 6.25     Current Medications: Current Facility-Administered Medications  Medication Dose Route  Frequency Provider Last Rate Last Dose  . alum & mag hydroxide-simeth (MAALOX/MYLANTA) 200-200-20 MG/5ML suspension 30 mL  30 mL Oral Q4H PRN Kerry Hough, PA-C      . buPROPion (WELLBUTRIN XL) 24 hr tablet 150 mg  150 mg Oral Daily Adonis Brook, NP   150 mg at 12/20/14 0744  . cyclobenzaprine (FLEXERIL) tablet 10 mg  10 mg Oral TID PRN Sanjuana Kava, NP   10 mg at 12/20/14 0618  . divalproex (DEPAKOTE ER) 24 hr tablet 250 mg  250 mg Oral BID Rachael Fee, MD   250 mg at 12/20/14 1621  . gabapentin (NEURONTIN) capsule 600 mg  600 mg Oral QID Leata Mouse, MD   600 mg at 12/20/14 1621  . lidocaine (LIDODERM) 5 % 1 patch  1 patch Transdermal Daily Rachael Fee, MD   1 patch at 12/20/14 1622  . magnesium hydroxide (MILK OF MAGNESIA) suspension 30 mL  30 mL Oral Daily PRN Kerry Hough, PA-C      . multivitamin with minerals tablet 1 tablet  1 tablet Oral Daily Kerry Hough, PA-C   1 tablet at 12/20/14 0744  . pantoprazole (PROTONIX) EC tablet 40 mg  40 mg Oral BID AC Adonis Brook, NP   40 mg at 12/20/14 1620  . risperiDONE (RISPERDAL) tablet 2 mg  2 mg Oral QHS Adonis Brook, NP   2 mg at 12/19/14 2119  . thiamine (B-1) injection 100 mg  100 mg Intramuscular Once Kerry Hough, PA-C   100 mg at 12/15/14 1610  . thiamine (VITAMIN B-1) tablet 100 mg  100 mg Oral Daily Kerry Hough, PA-C   100 mg at 12/20/14 0744  . traZODone (DESYREL) tablet 50 mg  50 mg Oral QHS,MR X 1 Kerry Hough, PA-C   50 mg at 12/19/14 2120    Lab Results: No results found for this or any previous visit (from the past 48 hour(s)).  Physical Findings: AIMS: Facial and Oral Movements Muscles of Facial Expression: None, normal Lips and Perioral Area: None, normal Jaw: None, normal Tongue: None, normal,Extremity Movements Upper (arms, wrists, hands, fingers): None, normal Lower (legs, knees, ankles, toes): None, normal, Trunk Movements Neck, shoulders, hips: None, normal, Overall  Severity Severity of abnormal movements (highest score from questions above): None, normal Incapacitation due to abnormal movements: None, normal Patient's awareness of abnormal movements (rate only patient's report): No Awareness, Dental Status Current problems with teeth and/or dentures?: No Does patient usually wear dentures?: No  CIWA:  CIWA-Ar Total: 0 COWS:     Treatment Plan Summary: Daily contact with patient to assess and evaluate symptoms and progress in treatment and Medication management Supportive approach/coping skills Alcohol dependence; continue to work a  relapse prevention plan Mood instability; will optimize the Depakote dose. Will increase by 250 mg and get a level in the AM Will continue to work with CBT/mindfulness Back pain; will start a Lidoderm patch to his back 12 HRS on and 12 HRS off Depakote level in AM Medical Decision Making:  Review of Psycho-Social Stressors (1), Review or order clinical lab tests (1), Review of Medication Regimen & Side Effects (2) and Review of New Medication or Change in Dosage (2)     Alaila Pillard A 12/20/2014, 5:02 PM

## 2014-12-20 NOTE — Tx Team (Signed)
Interdisciplinary Treatment Plan Update (Adult) Date: 12/20/2014   Time Reviewed: 9:30 AM  Progress in Treatment: Attending groups: Yes Participating in groups: Yes Taking medication as prescribed: Yes Tolerating medication: Yes Family/Significant other contact made: No, CSW unable to contact girlfriend at this time due to restraining order. Patient has declined for any other collateral contact Patient understands diagnosis: Yes Discussing patient identified problems/goals with staff: Yes Medical problems stabilized or resolved: Yes Denies suicidal/homicidal ideation: Yes Issues/concerns per patient self-inventory: Yes Other:  New problem(s) identified: N/A  Discharge Plan or Barriers:  5/3: Patient [place] [facility] [place] or stay in a hotel (patient has disability income). Patient unable to stay with his girlfriend at this time due to restraining order per patient. Patient agreeable to [facility] and will be provided with PART bus fare [place] [facility].  Reason for Continuation of Hospitalization:  Depression Anxiety Medication Stabilization   Comments: N/A  Estimated length of stay: 1-2 days  For review of initial/current patient goals, please see plan of care. Patient is a 59 year old Male admitted for SI and ETOH abuse. Patient is recently homeless and [place] [facility]. Patient will be provided with PART bus fare [place] [facility]. Girlfriend has a restraining order against him per patient. Patient will benefit from crisis stabilization, medication evaluation, group therapy, and psycho education in addition to case management for discharge planning. Patient and CSW reviewed pt's identified goals and treatment plan. Pt verbalized understanding and agreed to treatment plan.   Attendees: Patient:    Family:    Physician: Dr. Jama Flavorsobos; Dr. Dub MikesLugo 12/20/2014 9:30 AM   Nursing: Dellia CloudAndrea Thorne, Lendell CapriceBrittany Guthrie, RN 12/20/2014 9:30 AM  Clinical Social Worker: Samuella BruinKristin Easter Schinke, LCSWA 12/20/2014 9:30 AM  Other: Juline PatchQuylle Hodnett, Heather Smart, LCSW(A) 12/20/2014 9:30 AM  Other: Leisa LenzValerie Enoch, Vesta MixerMonarch Liaison 12/20/2014 9:30 AM  Other: Onnie BoerJennifer Clark, Case Manager 12/20/2014 9:30 AM  Other: Mosetta AnisAggie Nwoko, Laura Davis NP 12/20/2014 9:30 AM  Other:    Other:           Scribe for Treatment Team:  Samuella BruinKristin Sahory Nordling, MSW, [employer]CSWA [phone]

## 2014-12-20 NOTE — [facility] Group Notes (Signed)
[facility] LCSW Group Therapy  12/20/2014 1:24 PM  Type of Therapy:  Group Therapy  Participation Level:  Active  Participation Quality:  Inattentive  Affect:  Flat  Cognitive:  Lacking  Insight:  Poor  Engagement in Therapy:  Limited  Modes of Intervention:  Confrontation, Discussion, Education, Exploration, Problem-solving, Rapport Building, Socialization and Support  Summary of Progress/Problems: MHA Speaker came to talk about his personal journey with substance abuse and addiction. The pt processed ways by which to relate to the speaker. MHA speaker provided handouts and educational information pertaining to groups and services offered by the [facility].   Smart, Mariea Mcmartin LCSWA 12/20/2014, 1:24 PM

## 2014-12-20 NOTE — B[facility] Group Notes (Signed)
[facility] Group Notes:  (Nursing/MHT/Case Management/Adjunct)  Date:  12/20/2014  Time: 0900 am  Type of Therapy:  Psychoeducational Skills  Participation Level:  Active  Participation Quality:  Appropriate and Attentive  Affect:  Appropriate  Cognitive:  Alert and Appropriate  Insight:  Improving  Engagement in Group:  Engaged  Modes of Intervention:  Support  Summary of Progress/Problems:  Cranford MonBeaudry, Bryson Palen Evans 12/20/2014, 9:43 AM

## 2014-12-20 NOTE — Progress Notes (Signed)
D: Patient continues to complain of mood swings.  Patient states he is sleeping and eating well.  He states sometimes others "get on his nerves" and he trying to stay calm.  His fiance has a 50B against him and he is unable to return home.  He rates his depression as a 4; hopelessness as a 3; anxiety as a 7.  He is attending groups and participating in his treatment. He denies SI/HI/AVH.  Patient is a probable discharge on Thursday. A: Continue to monitor medication management and MD orders.  Safety checks completed every 15 minutes per protocol.  Meet 1:1 with patient to discuss concerns and offer encouragement. R: Patient's behavior is appropriate to situation.

## 2014-12-20 NOTE — Plan of Care (Signed)
Problem: Alteration in mood Goal: LTG-Pt's behavior demonstrates decreased signs of depression Pt rates depression as high and presents with depressed mood/irritable affect. By d/c, pt will rate depression as low (or as a 3 or less) and pt presentation will correspond to self report. Goal not met. [place], Neche 12/15/2014 1:06 PM   5/2: Adequate for discharge: Patient rates depression at 4 today but denies SI and reports feeling safe for discharge to follow up with outpatient services.  Tilden Fossa, MSW, LCSWA Clinical Social Worker [facility] [phone] Outcome: Progressing Patient reports decrease in depressive symptoms. Goal: STG-Patient is able to discuss feelings and issues (Patient is able to discuss feelings and issues leading to depression)  Outcome: Progressing Patient able to express his desire to get help with his anger issues.

## 2014-12-21 ENCOUNTER — Encounter (HOSPITAL_COMMUNITY): Payer: Self-pay | Admitting: Registered Nurse

## 2014-12-21 DIAGNOSIS — F10230 Alcohol dependence with withdrawal, uncomplicated: Secondary | ICD-10-CM | POA: Insufficient documentation

## 2014-12-21 LAB — VALPROIC ACID LEVEL: VALPROIC ACID LVL: 43 ug/mL — AB (ref 50.0–100.0)

## 2014-12-21 MED ORDER — PANTOPRAZOLE SODIUM 40 MG PO TBEC: 40.0000 mg | DELAYED_RELEASE_TABLET | Freq: Two times a day (BID) | ORAL | Status: DC

## 2014-12-21 MED ORDER — GABAPENTIN 800 MG PO TABS: 800.0000 mg | ORAL_TABLET | Freq: Four times a day (QID) | ORAL | Status: DC

## 2014-12-21 MED ORDER — DIVALPROEX SODIUM ER 250 MG PO TB24: 250.0000 mg | ORAL_TABLET | Freq: Three times a day (TID) | ORAL | Status: DC

## 2014-12-21 MED ORDER — TRAZODONE HCL 50 MG PO TABS
50.0000 mg | ORAL_TABLET | Freq: Every day | ORAL | Status: DC
  Filled 2014-12-21: qty 14

## 2014-12-21 MED ORDER — TRAZODONE HCL 50 MG PO TABS: 50.0000 mg | ORAL_TABLET | Freq: Every evening | ORAL | Status: DC | PRN

## 2014-12-21 MED ORDER — BUPROPION HCL ER (XL) 150 MG PO TB24: 150.0000 mg | ORAL_TABLET | Freq: Every day | ORAL | Status: DC

## 2014-12-21 MED ORDER — RISPERIDONE 2 MG PO TABS: 2.0000 mg | ORAL_TABLET | Freq: Every day | ORAL | Status: DC

## 2014-12-21 MED ORDER — GABAPENTIN 800 MG PO TABS
800.0000 mg | ORAL_TABLET | Freq: Four times a day (QID) | ORAL | Status: DC
  Filled 2014-12-21 (×4): qty 42

## 2014-12-21 NOTE — Progress Notes (Signed)
Recreation Therapy Notes  Animal-Assisted Activity (AAA) Program Checklist/Progress Notes Patient Eligibility Criteria Checklist & Daily Group note for Rec Tx Intervention  Date: 05.03.2.016 Time: 2:45pm Location: [address] Dayroom    AAA/T Program Assumption of Risk Form signed by Patient/ or Parent Legal Guardian yes  Patient is free of allergies or sever asthma yes  Patient reports no fear of animals yes  Patient reports no history of cruelty to animals yes  Patient understands his/her participation is voluntary yes  Behavioral Response: Did not attend.   Sharyl Panchal L Shellyann Wandrey, LRT/CTRS  Halley Kincer L 12/21/2014 8:40 AM 

## 2014-12-21 NOTE — Progress Notes (Signed)
  [facility] Discharge [facility] :  Will you be returning to the same living situation after discharge:  No, [place] [facility] [place] and reports that he will stay in a hotel or camp out At discharge, do you have transportation home?: Yes,  patient will be provided with PART bus fare [place] [facility]. Do you have the ability to pay for your medications: Yes,  patient will be provided with medication samples and prescriptions  Release of information consent forms completed and in the chart;  Patient's signature needed at discharge.  Patient to Follow up at: Follow-up Information    Follow up with [facility] On 12/23/2014.   Why:  Assessment for therapy, medication management, and ACTT team services on Friday May 6th at 10:30 am with Toni Amendourtney. Please bring your ID and insurance card. Please call if you need to reschedule your appointment.   Contact information:   [address] [place], [place] [address] Phone: [phone] Fax: [phone]      Patient denies SI/HI: Yes,  denies    Safety Planning and Suicide Prevention discussed: Yes,  with patient  Have you used any form of tobacco in the last 30 days? (Cigarettes, Smokeless Tobacco, Cigars, and/or Pipes): No  Has patient been referred to the Quitline?: N/A patient is not a smoker  Maddox Hlavaty, Belenda CruiseKristin L 12/21/2014, 9:32 AM

## 2014-12-21 NOTE — B[facility] Suicide Risk Assessment (Signed)
[facility]   Demographic Factors:  Male and Caucasian  Total Time spent with patient: 30 minutes  Musculoskeletal: Strength & Muscle Tone: within normal limits Gait & Station: normal Patient leans: N/A  Psychiatric Specialty Exam: Physical Exam  Review of Systems  Constitutional: Negative.   HENT: Negative.   Eyes: Negative.   Respiratory: Negative.   Cardiovascular: Negative.   Gastrointestinal: Negative.   Genitourinary: Negative.   Musculoskeletal: Negative.   Skin: Negative.   Neurological: Negative.   Endo/Heme/Allergies: Negative.   Psychiatric/Behavioral: Positive for substance abuse. The patient is nervous/anxious.     Blood pressure 107/80, pulse 81, temperature 98.2 F (36.8 C), temperature source Oral, resp. rate 20, height 5' 9.75" (1.772 m), weight 78.926 kg (174 lb).Body mass index is 25.14 kg/(m^2).  General Appearance: Fairly Groomed  Patent attorneyye Contact::  Fair  Speech:  Clear and Coherent409  Volume:  Normal  Mood:  Anxious  Affect:  Appropriate  Thought Process:  Coherent and Goal Directed  Orientation:  Full (Time, Place, and Person)  Thought Content:  plans as he moves on, relapse prevention plan  Suicidal Thoughts:  No  Homicidal Thoughts:  No  Memory:  Immediate;   Fair Recent;   Fair Remote;   Fair  Judgement:  Fair  Insight:  Present  Psychomotor Activity:  Restlessness  Concentration:  Fair  Recall:  [employer]Fair  Fund of Knowledge:Fair  Language: Fair  Akathisia:  No  Handed:  Right  AIMS (if indicated):     Assets:  Desire for Improvement Housing Social Support  Sleep:  Number of Hours: 6.25  Cognition: WNL  ADL's:  Intact   Have you used any form of tobacco in the last 30 days? (Cigarettes, Smokeless Tobacco, Cigars, and/or Pipes): No  Has this patient used any form of tobacco in the last 30 days? (Cigarettes, Smokeless Tobacco, Cigars, and/or Pipes) Yes, A prescription for an FDA-approved tobacco cessation  medication was offered at discharge and the patient refused  Mental Status Per Nursing Assessment::   On Admission:  NA  Current Mental Status by Physician: IN full contact with reality. There are no active SI plans or intent. There are no active S/S of withdrawal. He states that the medications are working well for him. He states that people have commented on what a big difference they see on him. He is committed to abstinence.    Loss Factors: NA  Historical Factors: NA  Risk Reduction Factors:   Sense of responsibility to family, Living with another person, especially a relative and Positive social support  Continued Clinical Symptoms:  Bipolar Disorder:   Depressive phase Alcohol/Substance Abuse/Dependencies  Cognitive Features That Contribute To Risk:  Closed-mindedness, Polarized thinking and Thought constriction (tunnel vision)    Suicide Risk:  Minimal: No identifiable suicidal ideation.  Patients presenting with no risk factors but with morbid ruminations; may be classified as minimal risk based on the severity of the depressive symptoms  Principal Problem: Bipolar I disorder, most recent episode depressed Discharge Diagnoses:  Patient Active Problem List   Diagnosis Date Noted  . PTSD (post-traumatic stress disorder) [F43.10] 12/15/2014  . Alcohol dependence [F10.20] 12/15/2014  . Bipolar I disorder, most recent episode depressed [F31.30] 12/15/2014    Follow-up Information    [facility] up with [facility] On 12/23/2014.   Why:  Assessment for therapy, medication management, and ACTT team services on Friday May 6th at 10:30 am with Toni Amendourtney. Please bring your ID and insurance card. Please call  if you need to reschedule your appointment.   Contact information:   [address] [place], [place] [address] Phone: [phone] Fax: [phone]      Plan Of Care/Follow-up recommendations:  Activity:  as tolerated Diet:  regular Follow up [facility] as  above Get a repeat of the liver enzymes in 2 weeks Is patient on multiple antipsychotic therapies at discharge:  No   Has Patient had three or more failed trials of antipsychotic monotherapy by history:  No  Recommended Plan for Multiple Antipsychotic Therapies: NA    Dezerae Freiberger A 12/21/2014, 1:29 PM

## 2014-12-21 NOTE — Progress Notes (Signed)
Belongings returned to patient from locker. While in search room, AfghanistanLugo, MD advised patient to have liver enzymes checked before taking Depakote. Pt verbalized understanding and verbalized that he will have levels checked this coming Friday and will notify [facility] of results.

## 2014-12-21 NOTE — Plan of Care (Signed)
Problem: Alteration in mood & ability to function due to Goal: STG-Patient will attend groups Outcome: Progressing Pt attended evening wrap group and engage in discussion.

## 2014-12-21 NOTE — Progress Notes (Signed)
Recreation Therapy Notes  Date: 05.04.2016 Time: 9:30am Location: [address] Dayroom   Group Topic: Stress Management  Goal Area(s) Addresses:  Patient will actively participate in stress management techniques presented during session.   Behavioral Response: Did not attend.   Marykay Lexenise L Reighlyn Elmes, LRT/CTRS  Jearl KlinefelterBlanchfield, Raevin Wierenga L 12/21/2014 12:39 PM
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
## 2014-12-21 NOTE — Progress Notes (Signed)
Pt attended wrap-up group, as AA/NA did not show up this evening.  Each pt was to identify their goal and explain if goal was reached.  Pt's goal was "not to go off on anybody".  States that meds have been working pretty good and hopes to go home tomorrow if Depakote level is within range.  States that his plan once d/c'd is [facility] and try to get an ACT team.  In addition would like to take a 9 week anger management class.    Tomi BambergerMariya Mari Battaglia, MHT

## 2014-12-21 NOTE — [facility] Group Notes (Signed)
   [facility] LCS[facility] Discharge Planning Group Note  12/21/2014  8:45 AM   Participation Quality: Alert, Appropriate and Oriented  Mood/Affect: Appropriate  Depression Rating: 3  Anxiety Rating: 6-7  Thoughts of Suicide: Pt denies SI/HI  Will you contract for safety? Yes  Current AVH: Pt denies  Plan for Discharge/Comments: Pt attended discharge planning group and actively participated in group. CSW provided pt with today's workbook. Patient reports feeling "good" today and ready to discharge. [place] [facility] [place] and will stay in a hotel or camp out. [facility] for [facility].   Transportation Means: Pt reports access to transportation- patient will be provided with PART bus fare.  Supports: No supports mentioned at this time  Samuella BruinKristin Suzie Vandam, MSW, [employer]CSWA Clinical Social Worker [facility] [phone]

## 2014-12-21 NOTE — Discharge Summary (Signed)
Physician Discharge Summary Note  Patient:  William HalterDale Perez is an 59 y.o., male MRN:  [id number] DOB:  [birth year] Patient phone:  [phone] (home)  Patient address:   [address] [place] [address],  Total Time spent with patient: Greater than 30 minutes  Date of Admission:  12/15/2014 Date of Discharge: 12/21/2014  Reason for Admission:  Per H&P admission:  William HalterDale League is a 59 y.o. male who [facility] with SI thoughts x1 month with worsening depression, anxiety and racing thoughts. Pt states he has a plan to overdose on pills. He admits to 2 previous SI attempts by jumping from bridge and overdosing. Pt states he moved from [place] 6 months ago and says he was engaged in outpatient services with [facility] in [place]. Pt says he's been off his medications x6 months. Pt says he has become volatile and racing thoughts and drinking to self-medicate with alcohol and THC. He admits drinking 6-12 12pks of beer, daily. His last drink was 12/14/14, he drank 1-16oz beer. He also smokes 1 marijuana cigarette 2-3x's a week. His last use was 2 days ago. Pt says he legal issues--communicating threats, his court date in June 2016. Pt says he unable to obtain his antidepressant medication and wants to get back on his medications.   Today he was seen. He states that he ran out of meds for a month and is the primary reason why he is in the hospital. He states that he was brought in by a friend. He reports being agitated. He denies SI/HI or even being [facility] but at other places in and out of the state. His diagnoses include anxiety, depression and PTSD. He did not care to elaborate at this time. He also had been with [facility] [place]. His recent meds include Gabapentin and Bupropion.    Principal Problem: Bipolar I disorder, most recent episode depressed Discharge Diagnoses: Patient Active Problem List   Diagnosis Date Noted  . Alcohol dependence with  uncomplicated withdrawal [F10.230]   . PTSD (post-traumatic stress disorder) [F43.10] 12/15/2014  . Alcohol dependence [F10.20] 12/15/2014  . Bipolar I disorder, most recent episode depressed [F31.30] 12/15/2014    Musculoskeletal: Strength & Muscle Tone: within normal limits Gait & Station: normal Patient leans: N/A  Psychiatric Specialty Exam:  See Suicide Risk Assessment Physical Exam  Constitutional: He is oriented to person, place, and time.  Neck: Normal range of motion.  Respiratory: Effort normal.  Musculoskeletal: Normal range of motion.  Neurological: He is alert and oriented to person, place, and time.    Review of Systems  Psychiatric/Behavioral: Positive for substance abuse. Negative for suicidal ideas, hallucinations and memory loss. Depression: Stable. Nervous/anxious: Stable. Insomnia: Stable.     Blood pressure 107/80, pulse 81, temperature 98.2 F (36.8 C), temperature source Oral, resp. rate 20, height 5' 9.75" (1.772 m), weight 78.926 kg (174 lb).Body mass index is 25.14 kg/(m^2).    Have you used any form of tobacco in the last 30 days? (Cigarettes, Smokeless Tobacco, Cigars, and/or Pipes): No  Has this patient used any form of tobacco in the last 30 days? (Cigarettes, Smokeless Tobacco, Cigars, and/or Pipes) Yes, Prescription not provided because: Patient does not want prescription  Past Medical History:  Past Medical History  Diagnosis Date  . PTSD (post-traumatic stress disorder)   . Cirrhosis, alcoholic   . Acid reflux   . Degenerative arthritis of hip   . H/O: attempted suicide   . Depression   .  Anxiety     Past Surgical History  Procedure Laterality Date  . Wrist surgery     Family History: History reviewed. No pertinent family history. Social History:  History  Alcohol Use  . 42.0 oz/week  . 70 Cans of beer per week    Comment: 6 to 12 beers daily depending on "state of mind"     History  Drug Use  . 4.00 per week  . Special:  Marijuana    Comment: 2 to 3 times weekly    History   Social History  . Marital Status: Single    Spouse Name: N/A  . Number of Children: N/A  . Years of Education: N/A   Social History Main Topics  . Smoking status: Former Games developer  . Smokeless tobacco: Not on file  . Alcohol Use: 42.0 oz/week    70 Cans of beer per week     Comment: 6 to 12 beers daily depending on "state of mind"  . Drug Use: 4.00 per week    Special: Marijuana     Comment: 2 to 3 times weekly  . Sexual Activity: Yes    Birth Control/ Protection: None   Other Topics Concern  . None   Social History Narrative    Past Psychiatric History: Hospitalizations:  Outpatient Care:  Substance Abuse Care:  Self-Mutilation:  Suicidal Attempts:  Violent Behaviors:   Risk to Self: Is patient at risk for suicide?: Yes What has been your use of drugs/alcohol within the last 12 months?: pt reports alcohol abuse 6-12 (or more) beers daily "I've been drinking alot more lately." THC abuse daily for several years. pt reports 9 months of sobriety about three years ago. "I was doing well when I lived in [place] too and had a efw months of sobriety last year."  Risk to Others:   Prior Inpatient Therapy:   Prior Outpatient Therapy:    Level of Care:  OP  Hospital Course:  William Perez was admitted for Bipolar I disorder, most recent episode depressed and crisis management.  He was treated discharged with the medications listed below under Medication List.  Medical problems were identified and treated as needed.  Home medications were restarted as appropriate.  Improvement was monitored by observation and William Perez daily report of symptom reduction.  Emotional and mental status was monitored by daily self-inventory reports completed by William Perez and clinical staff.         William Perez was evaluated by the treatment team for stability and plans for continued recovery upon discharge.  William Perez motivation  was an integral factor for scheduling further treatment.  Employment, transportation, bed availability, health status, family support, and any pending legal issues were also considered during his hospital stay.  He was offered further treatment options upon discharge including but not limited to Residential, Intensive Outpatient, and Outpatient treatment.  William Perez will follow up with the services as listed below under Follow Up Information.     Upon completion of this admission the patient was both mentally and medically stable for discharge denying suicidal/homicidal ideation, auditory/visual/tactile hallucinations, delusional thoughts and paranoia.      Consults:  psychiatry  Significant Diagnostic Studies:  labs: CBC/Diff, CMET, ETOH, UDS  Discharge Vitals:   Blood pressure 107/80, pulse 81, temperature 98.2 F (36.8 C), temperature source Oral, resp. rate 20, height 5' 9.75" (1.772 m), weight 78.926 kg (174 lb). Body mass index is 25.14 kg/(m^2). Lab Results:   Results for orders placed or  performed during the hospital encounter of 12/15/14 (from the past 72 hour(s))  Valproic acid level     Status: Abnormal   Collection Time: 12/21/14  6:45 AM  Result Value Ref Range   Valproic Acid Lvl 43 (L) 50.0 - 100.0 ug/mL    Comment: Performed at [facility]    Physical Findings: AIMS: Facial and Oral Movements Muscles of Facial Expression: None, normal Lips and Perioral Area: None, normal Jaw: None, normal Tongue: None, normal,Extremity Movements Upper (arms, wrists, hands, fingers): None, normal Lower (legs, knees, ankles, toes): None, normal, Trunk Movements Neck, shoulders, hips: None, normal, Overall Severity Severity of abnormal movements (highest score from questions above): None, normal Incapacitation due to abnormal movements: None, normal Patient's awareness of abnormal movements (rate only patient's report): No Awareness, Dental Status Current problems with teeth  and/or dentures?: No Does patient usually wear dentures?: No  CIWA:  CIWA-Ar Total: 0 COWS:      See Psychiatric Specialty Exam and Suicide Risk Assessment completed by Attending Physician prior to discharge.  Discharge destination:  Home  Is patient on multiple antipsychotic therapies at discharge:  No   Has Patient had three or more failed trials of antipsychotic monotherapy by history:  No    Recommended Plan for Multiple Antipsychotic Therapies: NA      Discharge Instructions    Activity as tolerated - No restrictions    Complete by:  As directed      Diet general    Complete by:  As directed      Discharge instructions    Complete by:  As directed   Take all of you medications as prescribed by your mental healthcare provider.  Report any adverse effects and reactions from your medications to your outpatient provider promptly. Do not engage in alcohol and or illegal drug use while on prescription medicines. In the event of worsening symptoms call the crisis hotline, 911, and or go to the nearest emergency department for appropriate evaluation and treatment of symptoms. Follow-up with your primary care provider for your medical issues, concerns and or health care needs.   Keep all scheduled appointments.  If you are unable to keep an appointment call to reschedule.  Let the nurse know if you will need medications before next scheduled appointment.            Medication List    TAKE these medications      Indication   buPROPion 150 MG 24 hr tablet  Commonly known as:  WELLBUTRIN XL  Take 1 tablet (150 mg total) by mouth daily. For depression   Indication:  Major Depressive Disorder     divalproex 250 MG 24 hr tablet  Commonly known as:  DEPAKOTE ER  Take 1 tablet (250 mg total) by mouth 3 (three) times daily. For mood control   Indication:  mood control     gabapentin 800 MG tablet  Commonly known as:  NEURONTIN  Take 1 tablet (800 mg total) by mouth 4 (four)  times daily.   Indication:  Agitation     multivitamin with minerals Tabs tablet  Take 1 tablet by mouth daily.      pantoprazole 40 MG tablet  Commonly known as:  PROTONIX  Take 1 tablet (40 mg total) by mouth 2 (two) times daily before a meal. For GERD   Indication:  Gastroesophageal Reflux Disease     risperiDONE 2 MG tablet  Commonly known as:  RISPERDAL  Take 1 tablet (2 mg  total) by mouth at bedtime. For depression/mood control   Indication:  Major Depressive Disorder, Psychosis, mood control     traZODone 50 MG tablet  Commonly known as:  DESYREL  Take 1 tablet (50 mg total) by mouth at bedtime as needed for sleep.   Indication:  Trouble Sleeping       Follow-up Information    Follow up with [facility] On 12/23/2014.   Why:  Assessment for therapy, medication management, and ACTT team services on Friday May 6th at 10:30 am with Toni Amendourtney. Please bring your ID and insurance card. Please call if you need to reschedule your appointment.   Contact information:   [address] [place], [place] [address] Phone: [phone] Fax: [phone]      Follow-up recommendations:  Activity:  As tolerated Diet:  As tolerated  Comments:   Patient has been instructed to take medications as prescribed; and report adverse effects to outpatient provider.  Follow up with primary doctor for any medical issues and If symptoms recur report to nearest emergency or crisis hot line.    Total Discharge Time: Greater than 30 minutes  Signed: Assunta FoundRankin, Shuvon, FNP-BC 12/21/2014, 3:22 PM  I personally assessed the patient and formulated the plan Madie RenoIrving A. Dub MikesLugo, M.D.

## 2014-12-21 NOTE — Progress Notes (Signed)
Patient discharged per physician order; patient denies SI/HI and A/V hallucinations; patient received samples, prescriptions, and copy of AVS after it was reviewed; patient also received bus money for PART BUS; patient had no other questions or concerns at this time; patient left the unit ambulatory; patient verbalized and signed that he received all belongings; patient left the unit ambulatory with Donovan KailPatrice W. RN

## 2015-08-21 ENCOUNTER — Emergency Department (HOSPITAL_COMMUNITY): Payer: Medicaid Other

## 2015-08-21 ENCOUNTER — Encounter (HOSPITAL_COMMUNITY): Payer: Self-pay

## 2015-08-21 ENCOUNTER — Inpatient Hospital Stay (HOSPITAL_COMMUNITY)
Admission: AD | Admit: 2015-08-21 | Discharge: 2015-08-29 | DRG: 897 | Disposition: A | Discharge status: Rehabilitation Facility | Payer: Medicaid Other | Source: Intra-hospital | Attending: Psychiatry | Admitting: Psychiatry

## 2015-08-21 ENCOUNTER — Emergency Department (HOSPITAL_COMMUNITY)
Admission: EM | Admit: 2015-08-21 | Discharge: 2015-08-21 | Disposition: A | Discharge status: Other Acute Inpatient Hospital | Payer: Medicaid Other | Attending: Emergency Medicine | Admitting: Emergency Medicine

## 2015-08-21 DIAGNOSIS — F329 Major depressive disorder, single episode, unspecified: Secondary | ICD-10-CM | POA: Insufficient documentation

## 2015-08-21 DIAGNOSIS — R45851 Suicidal ideations: Secondary | ICD-10-CM | POA: Diagnosis present

## 2015-08-21 DIAGNOSIS — Z590 Homelessness: Secondary | ICD-10-CM

## 2015-08-21 DIAGNOSIS — K219 Gastro-esophageal reflux disease without esophagitis: Secondary | ICD-10-CM | POA: Diagnosis present

## 2015-08-21 DIAGNOSIS — Z87891 Personal history of nicotine dependence: Secondary | ICD-10-CM

## 2015-08-21 DIAGNOSIS — G4700 Insomnia, unspecified: Secondary | ICD-10-CM | POA: Diagnosis present

## 2015-08-21 DIAGNOSIS — F4310 Post-traumatic stress disorder, unspecified: Secondary | ICD-10-CM | POA: Diagnosis not present

## 2015-08-21 DIAGNOSIS — F32A Depression, unspecified: Secondary | ICD-10-CM

## 2015-08-21 DIAGNOSIS — F10230 Alcohol dependence with withdrawal, uncomplicated: Secondary | ICD-10-CM | POA: Diagnosis present

## 2015-08-21 DIAGNOSIS — F1020 Alcohol dependence, uncomplicated: Secondary | ICD-10-CM

## 2015-08-21 DIAGNOSIS — F419 Anxiety disorder, unspecified: Secondary | ICD-10-CM | POA: Insufficient documentation

## 2015-08-21 DIAGNOSIS — Z79899 Other long term (current) drug therapy: Secondary | ICD-10-CM | POA: Insufficient documentation

## 2015-08-21 DIAGNOSIS — R000 Tachycardia, unspecified: Secondary | ICD-10-CM | POA: Insufficient documentation

## 2015-08-21 DIAGNOSIS — K7030 Alcoholic cirrhosis of liver without ascites: Secondary | ICD-10-CM | POA: Diagnosis present

## 2015-08-21 DIAGNOSIS — F1310 Sedative, hypnotic or anxiolytic abuse, uncomplicated: Secondary | ICD-10-CM | POA: Insufficient documentation

## 2015-08-21 DIAGNOSIS — F319 Bipolar disorder, unspecified: Secondary | ICD-10-CM | POA: Diagnosis present

## 2015-08-21 DIAGNOSIS — F1010 Alcohol abuse, uncomplicated: Secondary | ICD-10-CM | POA: Insufficient documentation

## 2015-08-21 DIAGNOSIS — F3130 Bipolar disorder, current episode depressed, mild or moderate severity, unspecified: Secondary | ICD-10-CM | POA: Diagnosis present

## 2015-08-21 DIAGNOSIS — F1210 Cannabis abuse, uncomplicated: Secondary | ICD-10-CM | POA: Insufficient documentation

## 2015-08-21 DIAGNOSIS — F10239 Alcohol dependence with withdrawal, unspecified: Secondary | ICD-10-CM | POA: Diagnosis present

## 2015-08-21 LAB — I-STAT CG4 LACTIC ACID, ED
LACTIC ACID, VENOUS: 2.65 mmol/L — AB (ref 0.5–2.0)
Lactic Acid, Venous: 1.12 mmol/L (ref 0.5–2.0)

## 2015-08-21 LAB — CBC WITH DIFFERENTIAL/PLATELET
BASOS PCT: 1 %
Basophils Absolute: 0.1 10*3/uL (ref 0.0–0.1)
EOS ABS: 0.1 10*3/uL (ref 0.0–0.7)
Eosinophils Relative: 3 %
HCT: 45.6 % (ref 39.0–52.0)
HEMOGLOBIN: 15.5 g/dL (ref 13.0–17.0)
Lymphocytes Relative: 14 %
Lymphs Abs: 0.7 10*3/uL (ref 0.7–4.0)
MCH: 33.5 pg (ref 26.0–34.0)
MCHC: 34 g/dL (ref 30.0–36.0)
MCV: 98.7 fL (ref 78.0–100.0)
MONOS PCT: 8 %
Monocytes Absolute: 0.4 10*3/uL (ref 0.1–1.0)
NEUTROS PCT: 74 %
Neutro Abs: 3.9 10*3/uL (ref 1.7–7.7)
PLATELETS: 166 10*3/uL (ref 150–400)
RBC: 4.62 MIL/uL (ref 4.22–5.81)
RDW: 13.4 % (ref 11.5–15.5)
WBC: 5.2 10*3/uL (ref 4.0–10.5)

## 2015-08-21 LAB — URINALYSIS, ROUTINE W REFLEX MICROSCOPIC
BILIRUBIN URINE: NEGATIVE
GLUCOSE, UA: NEGATIVE mg/dL
HGB URINE DIPSTICK: NEGATIVE
KETONES UR: NEGATIVE mg/dL
Leukocytes, UA: NEGATIVE
Nitrite: NEGATIVE
PROTEIN: NEGATIVE mg/dL
Specific Gravity, Urine: 1.018 (ref 1.005–1.030)
pH: 8 (ref 5.0–8.0)

## 2015-08-21 LAB — RAPID URINE DRUG SCREEN, HOSP PERFORMED
Amphetamines: NOT DETECTED
Barbiturates: NOT DETECTED
Benzodiazepines: POSITIVE — AB
Cocaine: NOT DETECTED
OPIATES: NOT DETECTED
Tetrahydrocannabinol: POSITIVE — AB

## 2015-08-21 LAB — COMPREHENSIVE METABOLIC PANEL
ALT: 84 U/L — ABNORMAL HIGH (ref 17–63)
ANION GAP: 11 (ref 5–15)
AST: 113 U/L — ABNORMAL HIGH (ref 15–41)
Albumin: 3.6 g/dL (ref 3.5–5.0)
Alkaline Phosphatase: 93 U/L (ref 38–126)
BUN: <5 mg/dL — ABNORMAL LOW (ref 6–20)
CHLORIDE: 104 mmol/L (ref 101–111)
CO2: 27 mmol/L (ref 22–32)
Calcium: 9 mg/dL (ref 8.9–10.3)
Creatinine, Ser: 1.04 mg/dL (ref 0.61–1.24)
GFR calc non Af Amer: >60 mL/min (ref >60)
GLUCOSE: 107 mg/dL — AB (ref 65–99)
POTASSIUM: 3.8 mmol/L (ref 3.5–5.1)
SODIUM: 142 mmol/L (ref 135–145)
Total Bilirubin: 0.8 mg/dL (ref 0.3–1.2)
Total Protein: 6.4 g/dL — ABNORMAL LOW (ref 6.5–8.1)

## 2015-08-21 LAB — POC OCCULT BLOOD, ED: Fecal Occult Bld: NEGATIVE

## 2015-08-21 LAB — SALICYLATE LEVEL

## 2015-08-21 LAB — ETHANOL: Alcohol, Ethyl (B): 8 mg/dL — ABNORMAL HIGH (ref <5)

## 2015-08-21 LAB — ACETAMINOPHEN LEVEL

## 2015-08-21 LAB — LIPASE, BLOOD: LIPASE: 49 U/L (ref 11–51)

## 2015-08-21 MED ORDER — SODIUM CHLORIDE 0.9 % IV BOLUS (SEPSIS)
1000.0000 mL | Freq: Once | INTRAVENOUS | Status: AC
  Administered 2015-08-21: 1000 mL via INTRAVENOUS

## 2015-08-21 MED ORDER — LOPERAMIDE HCL 2 MG PO CAPS: 2.0000 mg | ORAL_CAPSULE | ORAL | Status: AC | PRN

## 2015-08-21 MED ORDER — RISPERIDONE 0.5 MG PO TABS: 2.0000 mg | ORAL_TABLET | Freq: Every day | ORAL | Status: DC

## 2015-08-21 MED ORDER — TRAZODONE HCL 50 MG PO TABS: 50.0000 mg | ORAL_TABLET | Freq: Every evening | ORAL | Status: DC | PRN

## 2015-08-21 MED ORDER — PANTOPRAZOLE SODIUM 40 MG IV SOLR
40.0000 mg | Freq: Once | INTRAVENOUS | Status: AC
  Administered 2015-08-21: 40 mg via INTRAVENOUS
  Filled 2015-08-21: qty 40

## 2015-08-21 MED ORDER — LORAZEPAM 1 MG PO TABS
1.0000 mg | ORAL_TABLET | Freq: Every day | ORAL | Status: AC
  Administered 2015-08-26: 1 mg via ORAL
  Filled 2015-08-21: qty 1

## 2015-08-21 MED ORDER — LORAZEPAM 2 MG/ML IJ SOLN: 0.0000 mg | Freq: Four times a day (QID) | INTRAMUSCULAR | Status: DC

## 2015-08-21 MED ORDER — PANTOPRAZOLE SODIUM 40 MG PO TBEC
40.0000 mg | DELAYED_RELEASE_TABLET | Freq: Two times a day (BID) | ORAL | Status: DC
  Administered 2015-08-21: 40 mg via ORAL
  Filled 2015-08-21: qty 1

## 2015-08-21 MED ORDER — TRAZODONE HCL 50 MG PO TABS
50.0000 mg | ORAL_TABLET | Freq: Every evening | ORAL | Status: DC | PRN
  Administered 2015-08-22 – 2015-08-23 (×4): 50 mg via ORAL
  Filled 2015-08-21 (×4): qty 1

## 2015-08-21 MED ORDER — RISPERIDONE 2 MG PO TABS
2.0000 mg | ORAL_TABLET | Freq: Every day | ORAL | Status: DC
  Administered 2015-08-22 – 2015-08-28 (×8): 2 mg via ORAL
  Filled 2015-08-21 (×10): qty 1

## 2015-08-21 MED ORDER — BUPROPION HCL ER (XL) 150 MG PO TB24
150.0000 mg | ORAL_TABLET | Freq: Every day | ORAL | Status: DC
  Administered 2015-08-21: 150 mg via ORAL
  Filled 2015-08-21: qty 1

## 2015-08-21 MED ORDER — LORAZEPAM 2 MG/ML IJ SOLN
1.0000 mg | Freq: Once | INTRAMUSCULAR | Status: AC
  Administered 2015-08-21: 1 mg via INTRAVENOUS
  Filled 2015-08-21: qty 1

## 2015-08-21 MED ORDER — MAGNESIUM HYDROXIDE 400 MG/5ML PO SUSP: 30.0000 mL | Freq: Every day | ORAL | Status: DC | PRN

## 2015-08-21 MED ORDER — ADULT MULTIVITAMIN W/MINERALS CH
1.0000 | ORAL_TABLET | Freq: Every day | ORAL | Status: DC
  Administered 2015-08-23 – 2015-08-29 (×7): 1 via ORAL
  Filled 2015-08-21 (×9): qty 1

## 2015-08-21 MED ORDER — ONDANSETRON 4 MG PO TBDP: 4.0000 mg | ORAL_TABLET | Freq: Four times a day (QID) | ORAL | Status: AC | PRN

## 2015-08-21 MED ORDER — LORAZEPAM 1 MG PO TABS
1.0000 mg | ORAL_TABLET | Freq: Four times a day (QID) | ORAL | Status: AC
  Administered 2015-08-22 – 2015-08-23 (×5): 1 mg via ORAL
  Filled 2015-08-21 (×4): qty 1

## 2015-08-21 MED ORDER — IOHEXOL 300 MG/ML  SOLN
80.0000 mL | Freq: Once | INTRAMUSCULAR | Status: AC | PRN
  Administered 2015-08-21: 100 mL via INTRAVENOUS

## 2015-08-21 MED ORDER — ALUM & MAG HYDROXIDE-SIMETH 200-200-20 MG/5ML PO SUSP: 30.0000 mL | ORAL | Status: DC | PRN

## 2015-08-21 MED ORDER — LORAZEPAM 1 MG PO TABS
1.0000 mg | ORAL_TABLET | Freq: Four times a day (QID) | ORAL | Status: AC | PRN
  Administered 2015-08-23: 1 mg via ORAL
  Filled 2015-08-21: qty 1

## 2015-08-21 MED ORDER — GABAPENTIN 400 MG PO CAPS
800.0000 mg | ORAL_CAPSULE | Freq: Four times a day (QID) | ORAL | Status: DC
  Administered 2015-08-21: 800 mg via ORAL
  Filled 2015-08-21: qty 2

## 2015-08-21 MED ORDER — LORAZEPAM 1 MG PO TABS
1.0000 mg | ORAL_TABLET | Freq: Two times a day (BID) | ORAL | Status: AC
  Administered 2015-08-24 – 2015-08-25 (×2): 1 mg via ORAL
  Filled 2015-08-21 (×2): qty 1

## 2015-08-21 MED ORDER — THIAMINE HCL 100 MG/ML IJ SOLN
100.0000 mg | Freq: Once | INTRAMUSCULAR | Status: AC
  Administered 2015-08-21: 100 mg via INTRAVENOUS
  Filled 2015-08-21: qty 2

## 2015-08-21 MED ORDER — LORAZEPAM 2 MG/ML IJ SOLN
2.0000 mg | Freq: Once | INTRAMUSCULAR | Status: AC
  Administered 2015-08-21: 2 mg via INTRAVENOUS
  Filled 2015-08-21: qty 1

## 2015-08-21 MED ORDER — PANTOPRAZOLE SODIUM 40 MG PO TBEC
40.0000 mg | DELAYED_RELEASE_TABLET | Freq: Two times a day (BID) | ORAL | Status: DC
  Administered 2015-08-22 – 2015-08-29 (×14): 40 mg via ORAL
  Filled 2015-08-21 (×19): qty 1

## 2015-08-21 MED ORDER — GABAPENTIN 400 MG PO CAPS
800.0000 mg | ORAL_CAPSULE | Freq: Four times a day (QID) | ORAL | Status: DC
  Administered 2015-08-22 – 2015-08-29 (×28): 800 mg via ORAL
  Filled 2015-08-21 (×36): qty 2

## 2015-08-21 MED ORDER — VITAMIN B-1 100 MG PO TABS
100.0000 mg | ORAL_TABLET | Freq: Every day | ORAL | Status: DC
  Administered 2015-08-23 – 2015-08-29 (×7): 100 mg via ORAL
  Filled 2015-08-21 (×9): qty 1

## 2015-08-21 MED ORDER — ONDANSETRON HCL 4 MG/2ML IJ SOLN
4.0000 mg | Freq: Once | INTRAMUSCULAR | Status: AC
  Administered 2015-08-21: 4 mg via INTRAVENOUS
  Filled 2015-08-21: qty 2

## 2015-08-21 MED ORDER — HYDROXYZINE HCL 25 MG PO TABS
25.0000 mg | ORAL_TABLET | Freq: Four times a day (QID) | ORAL | Status: AC | PRN
  Administered 2015-08-22 – 2015-08-23 (×2): 25 mg via ORAL
  Filled 2015-08-21 (×2): qty 1

## 2015-08-21 MED ORDER — ADULT MULTIVITAMIN W/MINERALS CH
1.0000 | ORAL_TABLET | Freq: Every day | ORAL | Status: DC
  Administered 2015-08-21: 1 via ORAL
  Filled 2015-08-21: qty 1

## 2015-08-21 MED ORDER — LORAZEPAM 1 MG PO TABS
1.0000 mg | ORAL_TABLET | Freq: Three times a day (TID) | ORAL | Status: AC
  Administered 2015-08-23 – 2015-08-24 (×2): 1 mg via ORAL
  Filled 2015-08-21 (×3): qty 1

## 2015-08-21 NOTE — ED Notes (Signed)
Attempted report at behavioral health.

## 2015-08-21 NOTE — ED Notes (Signed)
Pt ambulated to the restroom in a steady manner 

## 2015-08-21 NOTE — Tx Team (Signed)
Initial Interdisciplinary Treatment Plan   PATIENT STRESSORS: Financial difficulties Marital or family conflict Medication change or noncompliance   PATIENT STRENGTHS: Capable of independent living General fund of knowledge   PROBLEM LIST: Problem List/Patient Goals Date to be addressed Date deferred Reason deferred Estimated date of resolution  "I want help with my drinking"      "I want help with depression"                                                 DISCHARGE CRITERIA:  Adequate post-discharge living arrangements Improved stabilization in mood, thinking, and/or behavior Verbal commitment to aftercare and medication compliance  PRELIMINARY DISCHARGE PLAN: Attend aftercare/continuing care group Attend 12-step recovery group Placement in alternative living arrangements  PATIENT/FAMIILY INVOLVEMENT: This treatment plan has been presented to and reviewed with the patient, Suan HalterDale Greiner, and/or family member, .  The patient and family have been given the opportunity to ask questions and make suggestions.  Andrena Mewsuttall, [place] Caslin J 08/21/2015, 11:13 PM

## 2015-08-21 NOTE — ED Notes (Signed)
Patient now leaving with [facility]

## 2015-08-21 NOTE — ED Notes (Signed)
Attempted report 

## 2015-08-21 NOTE — ED Notes (Signed)
[facility] contacted for transport 

## 2015-08-21 NOTE — ED Provider Notes (Signed)
CSN: [id number]     Arrival date & time 08/21/15  0911 History   First MD Initiated Contact with Patient 08/21/15 1013     Chief Complaint  Patient presents with  . Emesis  . Suicidal   HPI   William Perez is an 60 y.o. male with history of alcohol abuse, alcoholic cirrhoisis, GERD, depression, anxiety and PTSD who presents to the ED for evaluation of nausea, vomiting, abdominal pain, and SI. Pt states he is a severe alcoholic and admits to drinking 12-28 bottles of Mike's harder lemonade daily. He states that for the past week or so he has had increased nausea and abdominal pain. States the pain is associated with nausea and vomiting. He states it has progressively gotten worse and today he is unable to keep any fluids down. He states now he has been dryheaving but has not been vomiting since he has not eaten or drank anything today. He states that he usually does not eat much due to alcohol use. He also states he has noticed a few episodes of black stools. Denies hematemsis. Last EtOH intake last night. States he tried this morning but could not keep it down. Also endorses daily MJ use. Denies other drugs. Denies fever, chills, chest pain, SOB.  Pt states he is also here for help because he feels depressed and last night began having SI. States he has had two prior SI attempts, last in 2005. Denies HI. Denies hallucinations. States he does not have access to firearms at home. States he has been off of any psych meds for "months."  Past Medical History  Diagnosis Date  . PTSD (post-traumatic stress disorder)   . Cirrhosis, alcoholic (HCC)   . Acid reflux   . Degenerative arthritis of hip   . H/O: attempted suicide   . Depression   . Anxiety    Past Surgical History  Procedure Laterality Date  . Wrist surgery     No family history on file. Social History  Substance Use Topics  . Smoking status: Former Games developermoker  . Smokeless tobacco: None  . Alcohol Use: 42.0 oz/week    70 Cans of beer  per week     Comment: 6 to 12 beers daily depending on "state of mind"    Review of Systems  All other systems reviewed and are negative.     Allergies  Morphine and related  Home Medications   Prior to Admission medications   Medication Sig Start Date End Date Taking? Authorizing Provider  buPROPion (WELLBUTRIN XL) 150 MG 24 hr tablet Take 1 tablet (150 mg total) by mouth daily. For depression 12/21/14   Shuvon B Rankin, NP  divalproex (DEPAKOTE ER) 250 MG 24 hr tablet Take 1 tablet (250 mg total) by mouth 3 (three) times daily. For mood control 12/21/14   Shuvon B Rankin, NP  gabapentin (NEURONTIN) 800 MG tablet Take 1 tablet (800 mg total) by mouth 4 (four) times daily. 12/21/14   Shuvon B Rankin, NP  Multiple Vitamin (MULTIVITAMIN WITH MINERALS) TABS tablet Take 1 tablet by mouth daily.    Historical Provider, MD  pantoprazole (PROTONIX) 40 MG tablet Take 1 tablet (40 mg total) by mouth 2 (two) times daily before a meal. For GERD 12/21/14   Shuvon B Rankin, NP  risperiDONE (RISPERDAL) 2 MG tablet Take 1 tablet (2 mg total) by mouth at bedtime. For depression/mood control 12/21/14   Shuvon B Rankin, NP  traZODone (DESYREL) 50 MG tablet Take 1 tablet (50  mg total) by mouth at bedtime as needed for sleep. 12/21/14   Shuvon B Rankin, NP   BP 152/97 mmHg  Pulse 120  Temp(Src) 99.6 F (37.6 C)  Resp 20  SpO2 96% Physical Exam  Constitutional: He is oriented to person, place, and time.  Pale, ill appearing, tremulous  HENT:  Right Ear: External ear normal.  Left Ear: External ear normal.  Nose: Nose normal.  Mouth/Throat: Oropharynx is clear and moist.  Eyes: Conjunctivae and EOM are normal.  Neck: Normal range of motion. Neck supple.  Cardiovascular: Regular rhythm and normal heart sounds.  Tachycardia present.   Pulmonary/Chest: Effort normal and breath sounds normal. No respiratory distress. He exhibits no tenderness.  Abdominal: Soft. Bowel sounds are normal. There is tenderness in  the right lower quadrant, periumbilical area and left lower quadrant. There is guarding. There is no CVA tenderness, no tenderness at McBurney's point and negative Murphy's sign.  Musculoskeletal: He exhibits no edema or tenderness.  Neurological: He is alert and oriented to person, place, and time. No cranial nerve deficit.  Skin: Skin is warm and dry. There is pallor.  Psychiatric: His speech is normal. He is not agitated and not actively hallucinating. He exhibits a depressed mood.    ED Course  Procedures (including critical care time) Labs Review Labs Reviewed  COMPREHENSIVE METABOLIC PANEL - Abnormal; Notable for the following:    Glucose, Bld 107 (*)    BUN <5 (*)    Total Protein 6.4 (*)    AST 113 (*)    ALT 84 (*)    All other components within normal limits  ETHANOL - Abnormal; Notable for the following:    Alcohol, Ethyl (B) 8 (*)    All other components within normal limits  ACETAMINOPHEN LEVEL - Abnormal; Notable for the following:    Acetaminophen (Tylenol), Serum <10 (*)    All other components within normal limits  I-STAT CG4 LACTIC ACID, ED - Abnormal; Notable for the following:    Lactic Acid, Venous 2.65 (*)    All other components within normal limits  CBC WITH DIFFERENTIAL/PLATELET  SALICYLATE LEVEL  LIPASE, BLOOD  URINALYSIS, ROUTINE W REFLEX MICROSCOPIC (NOT AT [facility])  URINE RAPID DRUG SCREEN, HOSP PERFORMED  OCCULT BLOOD X 1 CARD TO LAB, STOOL  POC OCCULT BLOOD, ED  I-STAT CG4 LACTIC ACID, ED    Imaging Review Ct Abdomen Pelvis W Contrast  08/21/2015  CLINICAL DATA:  60 year old male complaining of pain throughout the entire lower abdomen. Nausea and vomiting. Black stools. EXAM: CT ABDOMEN AND PELVIS WITH CONTRAST TECHNIQUE: Multidetector CT imaging of the abdomen and pelvis was performed using the standard protocol following bolus administration of intravenous contrast. CONTRAST:  OMNIPAQUE IOHEXOL 300 MG/ML  SOLN COMPARISON:  No priors. FINDINGS:  Lower chest: Atherosclerotic calcifications in the left anterior descending coronary artery. Hepatobiliary: No cystic or solid hepatic lesions. Diffuse low attenuation throughout the hepatic parenchyma, compatible with hepatic steatosis. Calcified granuloma in the right lobe of the liver. Slightly nodular contour of the liver, compatible with early changes of cirrhosis. No intra or extrahepatic biliary ductal dilatation. Mild edema of the gallbladder wall throughout the fundus. Gallbladder is otherwise normal in appearance. Specifically, no gallstones. Additionally, the gallbladder is nearly decompressed. Pancreas: No pancreatic mass. No pancreatic ductal dilatation. No pancreatic or peripancreatic fluid or inflammatory changes. Spleen: Unremarkable. Adrenals/Urinary Tract: Bilateral adrenal glands and bilateral kidneys are normal in appearance. No hydroureteronephrosis. Urinary bladder is normal in appearance. Stomach/Bowel: Normal  appearance of the stomach. No pathologic dilatation of small bowel or colon. Normal appendix. Vascular/Lymphatic: Mild atherosclerosis throughout the abdominal and pelvic vasculature, without evidence of aneurysm or dissection. Portal vein is mildly dilated measuring 18 mm in diameter. No lymphadenopathy noted in the abdomen or pelvis. Reproductive: Prostate gland and seminal vesicles are normal in appearance. Other: No significant volume of ascites.  No pneumoperitoneum. Musculoskeletal: There are no aggressive appearing lytic or blastic lesions noted in the visualized portions of the skeleton. IMPRESSION: 1. No acute findings in the abdomen or pelvis to account for the patient's symptoms. 2. Morphologic changes in the liver indicative of early cirrhosis with associated hepatic steatosis, and dilated portal vein, indicative of developing portal hypertension. 3. Mild edema in the wall of the gallbladder in the fundal portion. This is likely related to intrinsic liver disease, as there  are no gallstones or surrounding inflammatory changes at this time. 4. Atherosclerosis, including left anterior descending coronary artery disease. Electronically Signed   By: Trudie Reed M.D.   On: 08/21/2015 13:48   I have personally reviewed and evaluated these images and lab results as part of my medical decision-making.   EKG Interpretation None      MDM   Final diagnoses:  None    i suspect likely alcoholic gastritis. However pt is markedly tender including with guarding. Will order abdominal labs and CT abd/pelvis. Will give 1L NS bolus, thiamine, ativan, zofran, and protonix. Will order lactic acid.   Lactic acid improved with 2L NS bolus. Pt placed on CIWA. Labs otherwise unremarkable. CT negative. Will PO challenge. If can tolerate PO then pt is medically clear for TTS consult.   Medically clear. Will consult TTS.   Carlene Coria, PA-C 08/21/15 1627  Arby Barrette, MD 08/23/15 [phone]

## 2015-08-21 NOTE — BH Assessment (Addendum)
Tele Assessment Note   William Perez is an 60 y.o. male presenting this date for thoughts of self harm with a plan to run into traffic or jump of a bridge. Patient reports multiple SI attempts with the last one being in 2005 and then again in 2014 where patient jumped off a bridge causing extensive back damage Patient has a long history of alcohol dependence and alcoholic cirrhosis admiting to drinking 12-28 bottles of Mike's harder lemonade daily. Patient reports nausea and vomiting that has progressively gotten worse this date to the point to where he has thoughts and a plan of self harm if he doesn't get help. Patient has had multiple hospitalizations reporting that he is interested in long term with his last treatment episode being in [place] at [facility].  Pt states he is also here for help because he feels depressed and last night began having thoughts of self harm Patient denies any HI and states he is currently not on any medications to assist with MH issues at this time. Patient denies hallucinations and is open to a voluntary admission. Case was staffed with Lilla Shook DNP who stated patient met criteria for inpatient admission and will be admitted to [facility] 301-02. Cook MD at [facility] was informed of disposition.     Diagnosis: Axis I: 305.00  Alcohol Dependence, 296.33 MDD Recurrent                   Axis II: Deferred                   Axis III: Cirrhosis, GERD                    Axis IV: Lack of primary support, housing, legal                   Axis V: 30 Past Medical History:  Past Medical History  Diagnosis Date  . PTSD (post-traumatic stress disorder)   . Cirrhosis, alcoholic ([place])   . Acid reflux   . Degenerative arthritis of hip   . H/O: attempted suicide   . Depression   . Anxiety     Past Surgical History  Procedure Laterality Date  . Wrist surgery      Family History: No family history on file.  Social History:  reports that he has quit smoking. He does not have any  smokeless tobacco history on file. He reports that he drinks about 42.0 oz of alcohol per week. He reports that he uses illicit drugs (Marijuana) about 4 times per week.  Additional Social History:  Alcohol / Drug Use Pain Medications: See MAR Prescriptions: See MAR Over the Counter: See MAR History of alcohol / drug use?: Yes Longest period of sobriety (when/how long): 3 months Withdrawal Symptoms: Agitation, Sweats, Cramps Substance #1 Name of Substance 1: Alcohol 1 - Age of First Use: 17 1 - Amount (size/oz): 6 to 8 12 oz. beers daily  1 - Frequency: daily 1 - Duration: 10 years 1 - Last Use / Amount: 08/19/15  CIWA: CIWA-Ar BP: 125/80 mmHg Pulse Rate: 89 Nausea and Vomiting: no nausea and no vomiting Tactile Disturbances: none Tremor: two Auditory Disturbances: very mild harshness or ability to frighten Paroxysmal Sweats: barely perceptible sweating, palms moist Visual Disturbances: not present Anxiety: two Headache, Fullness in Head: very mild Agitation: normal activity Orientation and Clouding of Sensorium: oriented and can do serial additions CIWA-Ar Total: 7 COWS:    PATIENT STRENGTHS: (  choose at least two) Ability for insight Motivation for treatment/growth  Allergies:  Allergies  Allergen Reactions  . Morphine And Related Hives    Home Medications:  (Not in a hospital admission)  OB/GYN Status:  No LMP for male patient.  General Assessment Data Location of Assessment: [facility] ED TTS Assessment: In system Is this a Tele or Face-to-Face Assessment?: Face-to-Face Is this an Initial Assessment or a Re-assessment for this encounter?: Initial Assessment Marital status: Divorced [place] name: na Is patient pregnant?: No Pregnancy Status: No Living Arrangements: Other (Comment) (Homeless) Can pt return to current living arrangement?: Yes Admission Status: Voluntary Is patient capable of signing voluntary admission?: Yes Referral Source:  Self/Family/Friend Insurance type: Medicaid  Medical Screening Exam ([place]) Medical Exam completed: Yes  Crisis Care Plan Living Arrangements: Other (Comment) (Homeless) Legal Guardian: Other: (none) Name of Psychiatrist: none Name of Therapist: none  Education Status Is patient currently in school?: No Current Grade: na Highest grade of school patient has completed: 8 Name of school: Theatre manager person: na  Risk to self with the past 6 months Suicidal Ideation: Yes-Currently Present Has patient been a risk to self within the past 6 months prior to admission? : Yes Suicidal Intent: Yes-Currently Present Has patient had any suicidal intent within the past 6 months prior to admission? : Yes Is patient at risk for suicide?: Yes Suicidal Plan?: Yes-Currently Present Has patient had any suicidal plan within the past 6 months prior to admission? : Yes Specify Current Suicidal Plan: Overdose or run into traffic Access to Means: Yes Specify Access to Suicidal Means: Patient states he will run into traffic What has been your use of drugs/alcohol within the last 12 months?: Daily use Previous Attempts/Gestures: Yes How many times?: 2 Other Self Harm Risks: none Triggers for Past Attempts: Other (Comment) (homeless) Intentional Self Injurious Behavior: None Family Suicide History: No Recent stressful life event(s): Other (Comment) (homeless) Persecutory voices/beliefs?: No Depression: Yes Depression Symptoms: Despondent Substance abuse history and/or treatment for substance abuse?: Yes Suicide prevention information given to non-admitted patients: Not applicable  Risk to Others within the past 6 months Homicidal Ideation: No Does patient have any lifetime risk of violence toward others beyond the six months prior to admission? : No Thoughts of Harm to Others: No Current Homicidal Intent: No Current Homicidal Plan: No Access to Homicidal Means: No Identified  Victim: na History of harm to others?: No Assessment of Violence: None Noted Violent Behavior Description: na Does patient have access to weapons?: No Criminal Charges Pending?: Yes Describe Pending Criminal Charges: Assault Does patient have a court date: Yes Court Date: 08/28/15 Is patient on probation?: No  Psychosis Hallucinations: None noted Delusions: None noted  Mental Status Report Appearance/Hygiene: In scrubs Eye Contact: Fair Motor Activity: Unremarkable Speech: Unremarkable Level of Consciousness: Alert Mood: Depressed Affect: Depressed Anxiety Level: Minimal Thought Processes: Coherent, Relevant Judgement: Unimpaired Orientation: Person, Place, Time Obsessive Compulsive Thoughts/Behaviors: None  Cognitive Functioning Concentration: Normal Memory: Recent Intact, Remote Intact IQ: Average Insight: Fair Impulse Control: Fair Appetite: Fair Weight Loss: 0 Weight Gain: 0 Sleep: No Change Total Hours of Sleep: 4 Vegetative Symptoms: None  ADLScreening [facility] Assessment Services) Patient's cognitive ability adequate to safely complete daily activities?: Yes Patient able to express need for assistance with ADLs?: Yes Independently performs ADLs?: Yes (appropriate for developmental age)  Prior Inpatient Therapy Prior Inpatient Therapy: Yes Prior Therapy Dates: 2011 Prior Therapy Facilty/Provider(s): [facility] Reason for Treatment: Alcohol use  Prior Outpatient Therapy  Prior Outpatient Therapy: Yes Prior Therapy Dates: 2014 Prior Therapy Facilty/Provider(s): [facility] Reason for Treatment: Alcohol use Does patient have an ACCT team?: No Does patient have Intensive In-House Services?  : No Does patient have [facility] services? : No Does patient have P4CC services?: No  ADL Screening (condition at time of admission) Patient's cognitive ability adequate to safely complete daily activities?: Yes Is the patient deaf or have difficulty hearing?: No Does  the patient have difficulty seeing, even when wearing glasses/contacts?: No Does the patient have difficulty concentrating, remembering, or making decisions?: No Patient able to express need for assistance with ADLs?: Yes Does the patient have difficulty dressing or bathing?: No Independently performs ADLs?: Yes (appropriate for developmental age) Does the patient have difficulty walking or climbing stairs?: No Weakness of Legs: None Weakness of Arms/Hands: None  Home Assistive Devices/Equipment Home Assistive Devices/Equipment: None  Therapy Consults (therapy consults require a physician order) PT Evaluation Needed: No OT Evalulation Needed: No SLP Evaluation Needed: No Abuse/Neglect Assessment (Assessment to be complete while patient is alone) Physical Abuse: Denies Verbal Abuse: Denies Sexual Abuse: Denies Exploitation of patient/patient's resources: Denies Self-Neglect: Denies Values / Beliefs Cultural Requests During Hospitalization: None Spiritual Requests During Hospitalization: None Consults Spiritual Care Consult Needed: No Social Work Consult Needed: No Regulatory affairs officer (For Healthcare) Does patient have an advance directive?: No    Additional Information 1:1 In Past 12 Months?: No CIRT Risk: No Elopement Risk: No Does patient have medical clearance?: Yes     Disposition: Case was staffed with Lilla Shook DNP who stated patient met criteria for inpatient admission and will be admitted to [facility] 301-02 per Sabra Heck MD. Lacinda Axon MD at [place] was informed of disposition.     Disposition Initial Assessment Completed for this Encounter: Yes Disposition of Patient: Inpatient treatment program Type of inpatient treatment program: Adult  Mamie Nick 08/21/2015 6:37 PM

## 2015-08-21 NOTE — ED Notes (Signed)
Patient here with fever, vomiting and suicidal thoughts. Has been off psych meds for weeks, daily etoh use

## 2015-08-22 ENCOUNTER — Encounter (HOSPITAL_COMMUNITY): Payer: Self-pay | Admitting: Psychiatry

## 2015-08-22 DIAGNOSIS — F3130 Bipolar disorder, current episode depressed, mild or moderate severity, unspecified: Secondary | ICD-10-CM

## 2015-08-22 DIAGNOSIS — R45851 Suicidal ideations: Secondary | ICD-10-CM

## 2015-08-22 DIAGNOSIS — F4310 Post-traumatic stress disorder, unspecified: Secondary | ICD-10-CM
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
## 2015-08-22 NOTE — BHH Group Notes (Signed)
BHH LCSW Group Therapy  08/22/2015 1:44 PM  Type of Therapy:  Group Therapy  Participation Level:  Did Not Attend-pt chose to remain in bed.  Summary of Progress/Problems: MHA Speaker came to talk about his personal journey with substance abuse and addiction. The pt processed ways by which to relate to the speaker. MHA speaker provided handouts and educational information pertaining to groups and services offered by the [facility].   Smart, Lauriel Helin LCSW 08/22/2015, 1:44 PM

## 2015-08-22 NOTE — Progress Notes (Signed)
Pt is a 60 year old male admitted with depression and ETOH dependence   Pt is suicidal with no clear defined plan but did jump off a bridge years ago   Pt drinks 12 to 28 bottles of Mikes Harder lemonade daily    Pt is currently homeless after a woman he was living with threw him out   Pt has psychiatric medications in his belongings but has not beem compliant   Pt was cooperative during the admission but needed redirection several times because of his disorganized thinking and racing thoughts     Pt reports being very sleepy and went to sleep before receiving his nighttime medications    Assessment completed and verbal support given    Q 15 min checks started   Pt is safe at present

## 2015-08-22 NOTE — BHH Suicide Risk Assessment (Signed)
[facility]   Nursing information obtained from:    Demographic factors:    Current Mental Status:    Loss Factors:    Historical Factors:    Risk Reduction Factors:    Total Time spent with patient: 45 minutes Principal Problem: Alcohol dependence with uncomplicated withdrawal (HCC) Diagnosis:   Patient Active Problem List   Diagnosis Date Noted  . Alcohol dependence with uncomplicated withdrawal (HCC) [F10.230]   . PTSD (post-traumatic stress disorder) [F43.10] 12/15/2014  . Bipolar I disorder, most recent episode depressed (HCC) [F31.30] 12/15/2014     Continued Clinical Symptoms:  Alcohol Use Disorder Identification Test Final Score (AUDIT): 32 The "Alcohol Use Disorders Identification Test", Guidelines for Use in Primary Care, Second Edition.  World Science writerHealth Organization [facility](WHO). Score between 0-7:  no or low risk or alcohol related problems. Score between 8-15:  moderate risk of alcohol related problems. Score between 16-19:  high risk of alcohol related problems. Score 20 or above:  warrants further diagnostic evaluation for alcohol dependence and treatment.   CLINICAL FACTORS:   Bipolar Disorder:   Mixed State Alcohol/Substance Abuse/Dependencies   Psychiatric Specialty Exam: Physical Exam  ROS  Blood pressure 128/74, pulse 83, temperature 98.1 F (36.7 C), temperature source Oral, resp. rate 16, height 5' 10.5" (1.791 m), weight 78.926 kg (174 lb).Body mass index is 24.61 kg/(m^2).    COGNITIVE FEATURES THAT CONTRIBUTE TO RISK:  Closed-mindedness, Polarized thinking and Thought constriction (tunnel vision)    SUICIDE RISK:   Moderate:  Frequent suicidal ideation with limited intensity, and duration, some specificity in terms of plans, no associated intent, good self-control, limited dysphoria/symptomatology, some risk factors present, and identifiable protective factors, including available and accessible social support.  PLAN OF CARE: See  Admission H and P  Medical Decision Making:  Review of Psycho-Social Stressors (1), Review or order clinical lab tests (1), Review of Medication Regimen & Side Effects (2) and Review of New Medication or Change in Dosage (2)  I certify that inpatient services furnished can reasonably be expected to improve the patient's condition.   Velmer Broadfoot A 08/22/2015, 5:16 PM
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
## 2015-08-22 NOTE — Tx Team (Signed)
Interdisciplinary Treatment Plan Update (Adult)  Date:  08/22/2015  Time Reviewed:  8:23 AM   Progress in Treatment: Attending groups: No. Participating in groups:  No. Taking medication as prescribed:  Yes. Tolerating medication:  Yes. Family/Significant othe contact made:  SPE required for pt.  Patient understands diagnosis:  Yes. and As evidenced by:  seeking treatment for self harm thoughts/SI, depression, ETOH abuse, and for medication stabilization.  Discussing patient identified problems/goals with staff:  Yes. Medical problems stabilized or resolved:  Yes. Denies suicidal/homicidal ideation: Yes. Issues/concerns per patient self-inventory:  Other:  New problem(s) identified:  Last admission: 11/2014 for similar issues.   Discharge Plan or Barriers: CSW assessing for appropriate referrals. Pt connected to [facility] during admission in 2016 for outpatient mental health services.   Reason for Continuation of Hospitalization: Depression Medication stabilization Suicidal ideation Withdrawal symptoms  Comments:  William Perez is an 60 y.o. male presenting this date for thoughts of self harm with a plan to run into traffic or jump of a bridge. Patient reports multiple SI attempts with the last one being in 2005 and then again in 2014 where patient jumped off a bridge causing extensive back damage Patient has a long history of alcohol dependence and alcoholic cirrhosis admiting to drinking 12-28 bottles of Mike's harder lemonade daily. Patient reports nausea and vomiting that has progressively gotten worse this date to the point to where he has thoughts and a plan of self harm if he doesn't get help. Patient has had multiple hospitalizations reporting that he is interested in long term with his last treatment episode being in [place] at [facility]. Pt states he is also here for help because he feels depressed and last night began having thoughts of self harm. Diagnosis: Axis  I: 305.00 Alcohol Dependence, 296.33 MDD Recurrent  Estimated length of stay:  3-5 days   New goal(s): to develop effective aftercare plan.   Additional Comments:  Patient and CSW reviewed pt's identified goals and treatment plan. Patient verbalized understanding and agreed to treatment plan. CSW reviewed [facility] "Discharge Process and Patient Involvement" Form. Pt verbalized understanding of information provided and signed form.    Review of initial/current patient goals per problem list:  1. Goal(s): Patient will participate in aftercare plan  Met: No.   Target date: at discharge  As evidenced by: Patient will participate within aftercare plan AEB aftercare provider and housing plan at discharge being identified.  08/22/15: CSW assessing for appropriate referrals.   2. Goal (s): Patient will exhibit decreased depressive symptoms and suicidal ideations.  Met: No.    Target date: at discharge  As evidenced by: Patient will utilize self rating of depression at 3 or below and demonstrate decreased signs of depression or be deemed stable for discharge by MD.  08/22/15: Pt reports high depression. Denies SI/HI/AVH this morning.   3. Goal(s): Patient will demonstrate decreased signs of withdrawal due to substance abuse  Met:No.   Target date:at discharge   As evidenced by: Patient will produce a CIWA/COWS score of 0, have stable vitals signs, and no symptoms of withdrawal.  08/22/15: Pt reports moderate withdrawals with CIWA score of 6 and stable vitals.   Attendees: Patient:   08/22/2015 8:23 AM   Family:   08/22/2015 8:23 AM   Physician:  Dr. Carlton Adam, MD 08/22/2015 8:23 AM   Nursing:   Charise Carwin RN 08/22/2015 8:23 AM   Clinical Social Worker: Maxie Better, LCSW 08/22/2015 8:23 AM   Clinical  Social Worker: Erasmo Downer Drinkard LCSWA; Peri Maris LCSWA 08/22/2015 8:23 AM   Other:  Gerline Legacy Nurse Case Manager 08/22/2015 8:23 AM   Other:   08/22/2015 8:23 AM   Other:   08/22/2015 8:23  AM   Other:  08/22/2015 8:23 AM   Other:  08/22/2015 8:23 AM   Other:  08/22/2015 8:23 AM    08/22/2015 8:23 AM    08/22/2015 8:23 AM    08/22/2015 8:23 AM    08/22/2015 8:23 AM    Scribe for Treatment Team:   Maxie Better, LCSW 08/22/2015 8:23 AM

## 2015-08-22 NOTE — Progress Notes (Signed)
Recreation Therapy Notes Animal-Assisted Activity (AAA) Program Checklist/Progress Notes Patient Eligibility Criteria Checklist & Daily Group note for Rec Tx Intervention  Date: 01.03.2017 Time: 2:45pm [facility] [address]    AAA/T Program Assumption of Risk Form signed by Patient/ or Parent Legal Guardian yes  Patient is free of allergies or sever asthma yes  Patient reports no fear of animals yes  Patient reports no history of cruelty to animals yes  Patient understands his/her participation is voluntary yes  Behavioral Response: Did not attend.  Marykay Lexenise L Sheldon Sem, LRT/CTRS  Lenette Rau L 08/22/2015 1:56 PM

## 2015-08-23 NOTE — Progress Notes (Signed)
Recreation Therapy Notes  Date: 01.04.2017 Time: 9:30am Location: [address] Group Room   Group Topic: Stress Management  Goal Area(s) Addresses:  Patient will actively participate in stress management techniques presented during session.   Behavioral Response: Appropriate   Intervention: Stress management techniques  Activity :  Deep Breathing and Guided Imagery. LRT provided instruction and demonstration on practice of Guided Imagery. Technique was coupled with deep breathing.   Education:  Stress Management, Discharge Planning.   Education Outcome: Acknowledges education  Clinical Observations/Feedback: Patient actively engaged in technique introduced, expressed no concerns and demonstrated ability to practice independently post d/c.   Taya Ashbaugh L Josephine Rudnick, LRT/CTRS        Amarilis Belflower L 08/23/2015 12:56 PM 

## 2015-08-23 NOTE — Progress Notes (Signed)
D    Pt is pleasant on approach   He does admit to some depression and anxiety    He can be somewhat intrusive but redirects well   Pt reports some mild to moderate withdrawal symptoms A   Verbal support given   Medications administered and effectiveness monitored    Q 15 min checks R   Pt is safe at present

## 2015-08-23 NOTE — [facility] Group Notes (Signed)
[facility] LCSW Group Therapy  08/23/2015 3:32 PM  Type of Therapy:  Group Therapy  Participation Level:  Did Not Attend-pt invited. Chose to remain in bed.   Modes of Intervention:  Confrontation, Discussion, Education, Exploration, Problem-solving, Rapport Building, Socialization and Support  Summary of Progress/Problems: Emotion Regulation: This group focused on both positive and negative emotion identification and allowed group members to process ways to identify feelings, regulate negative emotions, and find healthy ways to manage internal/external emotions. Group members were asked to reflect on a time when their reaction to an emotion led to a negative outcome and explored how alternative responses using emotion regulation would have benefited them. Group members were also asked to discuss a time when emotion regulation was utilized when a negative emotion was experienced.   Smart, Sachit Gilman LCSW 08/23/2015, 3:32 PM

## 2015-08-23 NOTE — [facility] Group Notes (Signed)
[facility]   08/23/2015 10:56 AM  Participation Quality:  Appropriate   Mood/Affect:  Irritable  Depression Rating:  "high"  Anxiety Rating:  "very anxious"   Thoughts of Suicide:  No Will you contract for safety?   N/a   Current AVH:  No  Plan for Discharge/Comments:  Pt reports that he would [facility] [place] [facility] [place]. "I'm from [place] and want to go out there. I also want someone to go get my belongings and tools from [place]." CSW explained what we are able to do for him as a hospital. Pt angry that noone can get his belongings. CSW encouraged pt to reach out to his family to help him with this. Onnie BoerJennifer Clark (UR) verified that [place]; therefore [facility] [place] have [facility]. CSW assessing.   Transportation Means: unknown at this time. Possibly a need for IVC for transport.   Supports: brother- "but I don't know his number."   Smart, William Umland LCSW

## 2015-08-23 NOTE — Progress Notes (Signed)
D-  Patient denies SI, HI and AVH.  Patient reported feeling increased anxiety due to not being able to contact people so that he can get his belongings for discharge..  Patient expressed that he has belongings that he needs to get from his previous residence prior to going to his next location.  A- assess client for safety, offer medications as prescribed, encourage patient to discuss discharge planning with social worker.   R-  Patient able to to contract for safety.  Patient had no incidents of behavioral dyscontrol.

## 2015-08-23 NOTE — Progress Notes (Signed)
[facility] MD Progress Note  08/23/2015 6:13 PM William Perez  MRN:  [id number] Subjective:  William Perez is trying to get his life back together. States that he will not be going back with the male he was living with. States he needs to fight the assault on a male charge as states he did not do it. States he wants to pursue treatment. He wants to get his mood under control. States he might be smiling but underneath he is irritated angry Principal Problem: Alcohol dependence with uncomplicated withdrawal (HCC) Diagnosis:   Patient Active Problem List   Diagnosis Date Noted  . Alcohol dependence with uncomplicated withdrawal (HCC) [F10.230]   . PTSD (post-traumatic stress disorder) [F43.10] 12/15/2014  . Bipolar I disorder, most recent episode depressed (HCC) [F31.30] 12/15/2014   Total Time spent with patient: 30 minutes  Past Psychiatric History: see admission H and P  Past Medical History:  Past Medical History  Diagnosis Date  . PTSD (post-traumatic stress disorder)   . Cirrhosis, alcoholic (HCC)   . Acid reflux   . Degenerative arthritis of hip   . H/O: attempted suicide   . Depression   . Anxiety     Past Surgical History  Procedure Laterality Date  . Wrist surgery     Family History: History reviewed. No pertinent family history. Family Psychiatric  History: see admission H and P Social History:  History  Alcohol Use  . 42.0 oz/week  . 70 Cans of beer per week    Comment: 6 to 12 beers daily depending on "state of mind"     History  Drug Use  . 4.00 per week  . Special: Marijuana    Comment: 2 to 3 times weekly    Social History   Social History  . Marital Status: Single    Spouse Name: N/A  . Number of Children: N/A  . Years of Education: N/A   Social History Main Topics  . Smoking status: Former Games developer  . Smokeless tobacco: None  . Alcohol Use: 42.0 oz/week    70 Cans of beer per week     Comment: 6 to 12 beers daily depending on "state of mind"  . Drug Use:  4.00 per week    Special: Marijuana     Comment: 2 to 3 times weekly  . Sexual Activity: Yes    Birth Control/ Protection: None   Other Topics Concern  . None   Social History Narrative   Additional Social History:    Pain Medications: See MAR Prescriptions: See MAR Over the Counter: See MAR History of alcohol / drug use?: Yes Longest period of sobriety (when/how long): 3 months Negative Consequences of Use: Work / Programmer, multimedia, Copywriter, advertising relationships, Armed forces operational officer, Surveyor, quantity Withdrawal Symptoms: Agitation, Sweats, Fever / Chills Name of Substance 1: Alcohol 1 - Age of First Use: 17 1 - Amount (size/oz): 6 to 8 12 oz. beers daily  1 - Frequency: daily 1 - Duration: 10 years 1 - Last Use / Amount: 08/19/15                  Sleep: Fair  Appetite:  Fair  Current Medications: Current Facility-Administered Medications  Medication Dose Route Frequency Provider Last Rate Last Dose  . alum & mag hydroxide-simeth (MAALOX/MYLANTA) 200-200-20 MG/5ML suspension 30 mL  30 mL Oral Q4H PRN Leata Mouse, MD      . gabapentin (NEURONTIN) capsule 800 mg  800 mg Oral QID Leata Mouse, MD   800 mg at 08/23/15  1716  . hydrOXYzine (ATARAX/VISTARIL) tablet 25 mg  25 mg Oral Q6H PRN Leata MouseJanardhana Jonnalagadda, MD   25 mg at 08/22/15 0354  . loperamide (IMODIUM) capsule 2-4 mg  2-4 mg Oral PRN Leata MouseJanardhana Jonnalagadda, MD      . LORazepam (ATIVAN) tablet 1 mg  1 mg Oral Q6H PRN Leata MouseJanardhana Jonnalagadda, MD      . LORazepam (ATIVAN) tablet 1 mg  1 mg Oral TID Leata MouseJanardhana Jonnalagadda, MD   1 mg at 08/23/15 1716   Followed by  . [START ON 08/24/2015] LORazepam (ATIVAN) tablet 1 mg  1 mg Oral BID Leata MouseJanardhana Jonnalagadda, MD       Followed by  . [START ON 08/26/2015] LORazepam (ATIVAN) tablet 1 mg  1 mg Oral Daily Leata MouseJanardhana Jonnalagadda, MD      . magnesium hydroxide (MILK OF MAGNESIA) suspension 30 mL  30 mL Oral Daily PRN Leata MouseJanardhana Jonnalagadda, MD      . multivitamin with minerals tablet  1 tablet  1 tablet Oral Daily Leata MouseJanardhana Jonnalagadda, MD   1 tablet at 08/23/15 0840  . ondansetron (ZOFRAN-ODT) disintegrating tablet 4 mg  4 mg Oral Q6H PRN Leata MouseJanardhana Jonnalagadda, MD      . pantoprazole (PROTONIX) EC tablet 40 mg  40 mg Oral BID AC Leata MouseJanardhana Jonnalagadda, MD   40 mg at 08/23/15 1716  . risperiDONE (RISPERDAL) tablet 2 mg  2 mg Oral QHS Leata MouseJanardhana Jonnalagadda, MD   2 mg at 08/22/15 2338  . thiamine (VITAMIN B-1) tablet 100 mg  100 mg Oral Daily Leata MouseJanardhana Jonnalagadda, MD   100 mg at 08/23/15 0841  . traZODone (DESYREL) tablet 50 mg  50 mg Oral QHS PRN,MR X 1 Leata MouseJanardhana Jonnalagadda, MD   50 mg at 08/22/15 2338    Lab Results: No results found for this or any previous visit (from the past 48 hour(s)).  Physical Findings: AIMS: Facial and Oral Movements Muscles of Facial Expression: None, normal Lips and Perioral Area: None, normal Jaw: None, normal Tongue: None, normal,Extremity Movements Upper (arms, wrists, hands, fingers): None, normal Lower (legs, knees, ankles, toes): None, normal, Trunk Movements Neck, shoulders, hips: None, normal, Overall Severity Severity of abnormal movements (highest score from questions above): None, normal Incapacitation due to abnormal movements: None, normal Patient's awareness of abnormal movements (rate only patient's report): No Awareness, Dental Status Current problems with teeth and/or dentures?: No Does patient usually wear dentures?: No  CIWA:  CIWA-Ar Total: 0 COWS:     Musculoskeletal: Strength & Muscle Tone: within normal limits Gait & Station: normal Patient leans: normal  Psychiatric Specialty Exam: Review of Systems  Constitutional: Negative.   HENT: Negative.   Eyes: Negative.   Respiratory: Negative.   Cardiovascular: Negative.   Gastrointestinal: Negative.   Genitourinary: Negative.   Musculoskeletal: Negative.   Skin: Negative.   Neurological: Negative.   Endo/Heme/Allergies: Negative.    Psychiatric/Behavioral: Positive for depression and substance abuse. The patient is nervous/anxious.     Blood pressure 112/72, pulse 89, temperature 98.4 F (36.9 C), temperature source Oral, resp. rate 16, height 5' 10.5" (1.791 m), weight 78.926 kg (174 lb), SpO2 100 %.Body mass index is 24.61 kg/(m^2).  General Appearance: Fairly Groomed  Patent attorneyye Contact::  Fair  Speech:  Clear and Coherent  Volume:  fluctutes  Mood:  Dysphoric  Affect:  Labile  Thought Process:  Coherent and Goal Directed  Orientation:  Full (Time, Place, and Person)  Thought Content:  symptoms events worries concerns  Suicidal Thoughts:  No  Homicidal Thoughts:  No  Memory:  Immediate;   Fair Recent;   Fair Remote;   Fair  Judgement:  Fair  Insight:  Shallow  Psychomotor Activity:  Restlessness  Concentration:  Fair  Recall:  [name] of Knowledge:Fair  Language: Fair  Akathisia:  No  Handed:  Right  AIMS (if indicated):     Assets:  Desire for Improvement  ADL's:  Intact  Cognition: WNL  Sleep:  Number of Hours: 6   Treatment Plan Summary: Daily contact with patient to assess and evaluate symptoms and progress in treatment and Medication management Supportive approach/coping skills Alcohol dependence; continue the Ativan detox protocol/work a relapse prevention plan Mood instability: continue to reassess, continue the Risperdal and optimize response Anxiety-agitation; continue to work with the Neurontin Work with CBT/mindfulness Explore residential treatment programs.  Blair Lundeen A 08/23/2015, 6:13 PM

## 2015-08-24 LAB — PSA: PSA: 0.3 ng/mL (ref 0.00–4.00)

## 2015-08-24 MED ORDER — TAMSULOSIN HCL 0.4 MG PO CAPS
0.4000 mg | ORAL_CAPSULE | Freq: Every day | ORAL | Status: DC
  Administered 2015-08-24 – 2015-08-29 (×6): 0.4 mg via ORAL
  Filled 2015-08-24 (×7): qty 1

## 2015-08-24 MED ORDER — TRAZODONE HCL 150 MG PO TABS
150.0000 mg | ORAL_TABLET | Freq: Every evening | ORAL | Status: DC | PRN
  Administered 2015-08-24 – 2015-08-28 (×6): 150 mg via ORAL
  Filled 2015-08-24 (×6): qty 1

## 2015-08-24 MED ORDER — CYCLOBENZAPRINE HCL 10 MG PO TABS
10.0000 mg | ORAL_TABLET | Freq: Three times a day (TID) | ORAL | Status: DC | PRN
  Administered 2015-08-24 – 2015-08-29 (×10): 10 mg via ORAL
  Filled 2015-08-24 (×10): qty 1

## 2015-08-24 NOTE — [facility] Group Notes (Signed)
[facility] Group Notes:  (Nursing/MHT/Case Management/Adjunct)  Date:  08/24/2015  Time:  0915  Type of Therapy:  Nurse Education  Participation Level:  Active  Participation Quality:  Appropriate and Attentive  Affect:  Appropriate  Cognitive:  Alert and Appropriate  Insight:  Appropriate  Engagement in Group:  Engaged  Modes of Intervention:  Clarification and Discussion  Summary of Progress/Problems: Was able to give a goal for the day.  Manuela Schwartzritchett, Danniella Robben [facility] 08/24/2015, 10:02 AM

## 2015-08-24 NOTE — Progress Notes (Signed)
[facility] MD Progress Note  08/24/2015 4:55 PM William Perez  MRN:  [id number] Subjective:  William Perez continues to have a hard time. He is trying to get himself together figure things out where to go from here. He did not sleep that well last night. Admits to a lot of ruminations that keeps him from falling asleep. Still endorsing mood swings. Also endorses muscle spasms for what he has taken Flexeril before. States he has a hard tim urinating has to push hard for the urine to get out Principal Problem: Alcohol dependence with uncomplicated withdrawal (HCC) Diagnosis:   Patient Active Problem List   Diagnosis Date Noted  . Alcohol dependence with uncomplicated withdrawal (HCC) [F10.230]   . PTSD (post-traumatic stress disorder) [F43.10] 12/15/2014  . Bipolar I disorder, most recent episode depressed (HCC) [F31.30] 12/15/2014   Total Time spent with patient: 30 minutes  Past Psychiatric History: see admission H and P  Past Medical History:  Past Medical History  Diagnosis Date  . PTSD (post-traumatic stress disorder)   . Cirrhosis, alcoholic (HCC)   . Acid reflux   . Degenerative arthritis of hip   . H/O: attempted suicide   . Depression   . Anxiety     Past Surgical History  Procedure Laterality Date  . Wrist surgery     Family History: History reviewed. No pertinent family history. Family Psychiatric  History: see Admission H and P Social History:  History  Alcohol Use  . 42.0 oz/week  . 70 Cans of beer per week    Comment: 6 to 12 beers daily depending on "state of mind"     History  Drug Use  . 4.00 per week  . Special: Marijuana    Comment: 2 to 3 times weekly    Social History   Social History  . Marital Status: Single    Spouse Name: N/A  . Number of Children: N/A  . Years of Education: N/A   Social History Main Topics  . Smoking status: Former Games developer  . Smokeless tobacco: None  . Alcohol Use: 42.0 oz/week    70 Cans of beer per week     Comment: 6 to 12 beers  daily depending on "state of mind"  . Drug Use: 4.00 per week    Special: Marijuana     Comment: 2 to 3 times weekly  . Sexual Activity: Yes    Birth Control/ Protection: None   Other Topics Concern  . None   Social History Narrative   Additional Social History:    Pain Medications: See MAR Prescriptions: See MAR Over the Counter: See MAR History of alcohol / drug use?: Yes Longest period of sobriety (when/how long): 3 months Negative Consequences of Use: Work / Programmer, multimedia, Copywriter, advertising relationships, Armed forces operational officer, Surveyor, quantity Withdrawal Symptoms: Agitation, Sweats, Fever / Chills Name of Substance 1: Alcohol 1 - Age of First Use: 17 1 - Amount (size/oz): 6 to 8 12 oz. beers daily  1 - Frequency: daily 1 - Duration: 10 years 1 - Last Use / Amount: 08/19/15                  Sleep: Poor  Appetite:  Fair  Current Medications: Current Facility-Administered Medications  Medication Dose Route Frequency Provider Last Rate Last Dose  . alum & mag hydroxide-simeth (MAALOX/MYLANTA) 200-200-20 MG/5ML suspension 30 mL  30 mL Oral Q4H PRN Leata Mouse, MD      . cyclobenzaprine (FLEXERIL) tablet 10 mg  10 mg Oral TID PRN Madie Reno  Jorja LoaA Tacuma Graffam, MD   10 mg at 08/24/15 1459  . gabapentin (NEURONTIN) capsule 800 mg  800 mg Oral QID Leata MouseJanardhana Jonnalagadda, MD   800 mg at 08/24/15 1149  . hydrOXYzine (ATARAX/VISTARIL) tablet 25 mg  25 mg Oral Q6H PRN Leata MouseJanardhana Jonnalagadda, MD   25 mg at 08/23/15 2131  . loperamide (IMODIUM) capsule 2-4 mg  2-4 mg Oral PRN Leata MouseJanardhana Jonnalagadda, MD      . LORazepam (ATIVAN) tablet 1 mg  1 mg Oral Q6H PRN Leata MouseJanardhana Jonnalagadda, MD   1 mg at 08/23/15 2132  . LORazepam (ATIVAN) tablet 1 mg  1 mg Oral BID Leata MouseJanardhana Jonnalagadda, MD       Followed by  . [START ON 08/26/2015] LORazepam (ATIVAN) tablet 1 mg  1 mg Oral Daily Leata MouseJanardhana Jonnalagadda, MD      . magnesium hydroxide (MILK OF MAGNESIA) suspension 30 mL  30 mL Oral Daily PRN Leata MouseJanardhana Jonnalagadda, MD       . multivitamin with minerals tablet 1 tablet  1 tablet Oral Daily Leata MouseJanardhana Jonnalagadda, MD   1 tablet at 08/24/15 0847  . ondansetron (ZOFRAN-ODT) disintegrating tablet 4 mg  4 mg Oral Q6H PRN Leata MouseJanardhana Jonnalagadda, MD      . pantoprazole (PROTONIX) EC tablet 40 mg  40 mg Oral BID AC Leata MouseJanardhana Jonnalagadda, MD   40 mg at 08/24/15 0555  . risperiDONE (RISPERDAL) tablet 2 mg  2 mg Oral QHS Leata MouseJanardhana Jonnalagadda, MD   2 mg at 08/23/15 2132  . tamsulosin (FLOMAX) capsule 0.4 mg  0.4 mg Oral Daily Rachael FeeIrving A Meredith Mells, MD   0.4 mg at 08/24/15 1140  . thiamine (VITAMIN B-1) tablet 100 mg  100 mg Oral Daily Leata MouseJanardhana Jonnalagadda, MD   100 mg at 08/24/15 0847  . traZODone (DESYREL) tablet 150 mg  150 mg Oral QHS PRN,MR X 1 Rachael FeeIrving A Madelene Kaatz, MD        Lab Results: No results found for this or any previous visit (from the past 48 hour(s)).  Physical Findings: AIMS: Facial and Oral Movements Muscles of Facial Expression: None, normal Lips and Perioral Area: None, normal Jaw: None, normal Tongue: None, normal,Extremity Movements Upper (arms, wrists, hands, fingers): None, normal Lower (legs, knees, ankles, toes): None, normal, Trunk Movements Neck, shoulders, hips: None, normal, Overall Severity Severity of abnormal movements (highest score from questions above): None, normal Incapacitation due to abnormal movements: None, normal Patient's awareness of abnormal movements (rate only patient's report): No Awareness, Dental Status Current problems with teeth and/or dentures?: No Does patient usually wear dentures?: No  CIWA:  CIWA-Ar Total: 3 COWS:     Musculoskeletal: Strength & Muscle Tone: within normal limits Gait & Station: normal Patient leans: normal  Psychiatric Specialty Exam: Review of Systems  Constitutional: Positive for malaise/fatigue.  HENT: Negative.   Eyes: Negative.   Respiratory: Negative.   Cardiovascular: Negative.   Gastrointestinal: Negative.   Genitourinary:  Negative.   Musculoskeletal: Negative.   Skin: Negative.   Neurological: Negative.   Endo/Heme/Allergies: Negative.   Psychiatric/Behavioral: Positive for depression and substance abuse. The patient is nervous/anxious.     Blood pressure 127/79, pulse 77, temperature 97.7 F (36.5 C), temperature source Oral, resp. rate 20, height 5' 10.5" (1.791 m), weight 78.926 kg (174 lb), SpO2 100 %.Body mass index is 24.61 kg/(m^2).  General Appearance: Fairly Groomed  Patent attorneyye Contact::  Fair  Speech:  Clear and Coherent  Volume:  fluctuates  Mood:  Anxious, Dysphoric and Irritable  Affect:  Labile  Thought  Process:  Coherent and Goal Directed  Orientation:  Full (Time, Place, and Person)  Thought Content:  symptoms events worries concerns  Suicidal Thoughts:  No  Homicidal Thoughts:  No  Memory:  Immediate;   Fair Recent;   Fair Remote;   Fair  Judgement:  Fair  Insight:  Present and Shallow  Psychomotor Activity:  Restlessness  Concentration:  Fair  Recall:  [name] of Knowledge:Fair  Language: Fair  Akathisia:  No  Handed:  Right  AIMS (if indicated):     Assets:  Desire for Improvement  ADL's:  Intact  Cognition: WNL  Sleep:  Number of Hours: 4   Treatment Plan Summary: Daily contact with patient to assess and evaluate symptoms and progress in treatment and Medication management Supportive approach/coping skills Alcohol dependence; continue the Ativan detox protocol/work a relapse prevention plan Insomnia: will increase the to Trazodone to 150 mg HS (his usual) Mood instability; will continue the Risperdal 2 mg HS Difficulty urinating; will use Flomax and will get a PSA Use CBT/mindfulness Explore residential treatment options Krysia Zahradnik A 08/24/2015, 4:55 PM

## 2015-08-24 NOTE — [facility] Group Notes (Signed)
[facility] LCSW Group Therapy  08/24/2015 1:05 PM  Type of Therapy:  Group Therapy  Participation Level:  Active  Participation Quality:  Attentive  Affect:  Appropriate  Cognitive:  Alert and Appropriate  Insight:  Engaged  Engagement in Therapy:  Improving  Modes of Intervention:  Confrontation, Discussion, Education, Exploration, Problem-solving, Rapport Building, Socialization and Support  Summary of Progress/Problems:  Finding Balance in Life. Today's group focused on defining balance in one's own words, identifying things that can knock one off balance, and exploring healthy ways to maintain balance in life. Group members were asked to provide an example of a time when they felt off balance, describe how they handled that situation,and process healthier ways to regain balance in the future. Group members were asked to share the most important tool for maintaining balance that they learned [facility] and how they plan to apply this method after discharge. William Perez was attentive and engaged during today's processing group. He shared that he feels off balance and is trying to get his life together. "I need to get my belongings before they are destroyed but I also need to get more help with my addiction." Pt agreed to [facility] referral and is working with staff to reach his brother who can potentially assist with helping him get his belongings. William Perez continues to show progress in the group setting.   Smart, Marga Gramajo LCSW 08/24/2015, 1:05 PM

## 2015-08-24 NOTE — Progress Notes (Signed)
[facility] Group Notes:  (Nursing/MHT/Case Management/Adjunct)  Date:  08/24/2015  Time:  2030  Type of Therapy:  wrap up group  Participation Level:  Active  Participation Quality:  Appropriate, Attentive, Sharing and Supportive  Affect:  Appropriate  Cognitive:  Appropriate  Insight:  Improving  Engagement in Group:  Engaged  Modes of Intervention:  Clarification, Education and Support  Summary of Progress/Problems: Pt [place] but is still grateful that [place] have a [place] in [place].   Shelah LewandowskySquires, Jersey Ravenscroft Carol 08/24/2015, 10:04 PM

## 2015-08-24 NOTE — Progress Notes (Signed)
[facility] referral sent.   Trula SladeHeather Smart, MSW, LCSW Clinical Social Worker 08/24/2015 4:00 PM

## 2015-08-24 NOTE — Progress Notes (Signed)
Patient ID: William Perez, male   DOB: September 25, 1955, 60 y.o.   MRN: [id number]  Pt currently presents with a flat affect and anxious behavior. Pt is demanding, needy in interaction with staff. Per self inventory, pt rates depression, hopelessness and anxiety at a 6. Pt's daily goal is to "get a plan" and they intend to do so by "talk to who I can." Pt reports fair sleep, a fair appetite, low energy and good concentration.   Pt provided with medications per providers orders. Pt's labs and vitals were monitored throughout the day. Pt supported emotionally and encouraged to express concerns and questions. Pt educated on medications.  Pt's safety ensured with 15 minute and environmental checks. Pt currently denies SI/HI and A/V hallucinations. Pt verbally agrees to seek staff if SI/HI or A/VH occurs and to consult with staff before acting on these thoughts. Will continue POC.

## 2015-08-25 MED ORDER — BUPROPION HCL ER (XL) 150 MG PO TB24
150.0000 mg | ORAL_TABLET | Freq: Every day | ORAL | Status: DC
  Administered 2015-08-25: 150 mg via ORAL
  Filled 2015-08-25 (×4): qty 1

## 2015-08-25 MED ORDER — BUPROPION HCL ER (XL) 300 MG PO TB24
300.0000 mg | ORAL_TABLET | Freq: Every day | ORAL | Status: DC
  Administered 2015-08-26 – 2015-08-29 (×4): 300 mg via ORAL
  Filled 2015-08-25 (×6): qty 1

## 2015-08-25 NOTE — [facility] Group Notes (Signed)
[facility]   08/25/2015 10:35 AM  Participation Quality:  Appropriate   Mood/Affect:  Irritable  Depression Rating:  6  Anxiety Rating:  7  Thoughts of Suicide:  No Will you contract for safety?   N/a   Current AVH:  No  Plan for Discharge/Comments:  Pt reports that he is agitated this morning "people here have no phone etiquette. Pt states that he was having trouble controlling his temper. Pt asked to speak with staff or go to room when he feels himself losing his temper and patient agreed to this plan. [facility] referral pending.   Transportation Means: unknown at this time.   Supports: brother "but i can't get his #Regulatory affairs officer"   Smart, Conservation officer, natureHeather LCSW

## 2015-08-25 NOTE — [facility] Group Notes (Signed)
[facility] LCSW Group Therapy Feelings Around Relapse  08/25/2015 2:52 PM  Type of Therapy:  Group Therapy  Participation Level:  Active   Modes of Intervention:  Confrontation, Discussion, Education, Exploration, Problem-solving, Rapport Building, Socialization and Support  Summary of Progress/Problems. Group members discussed the meaning of relapse and shared personal stories of relapse, how it affected them and others, and how they perceived themselves during this time. Group members were encouraged to identify triggers, warning signs and coping skills used when facing the possibility of relapse. Social supports were discussed and explored in detail. HALT BS was introduced as a way to identify common triggers for relapse (hunger, anger, loneliness, tiredness, boredom and stress).   Patient discussed hunger for "things I used to have like my family life, work, my Education administratorriding lawn mower and other things" as triggers for relapse, states she uses humor/laughter to help him manage distressing emotions.  Identified stress and anger as primary triggers, states he can use physical activity/exercise as ways to reduce stress.    Sallee LangeCunningham, Kelci Petrella C LCSW 08/25/2015, 2:52 PM

## 2015-08-25 NOTE — Progress Notes (Signed)
Adult Psychoeducational Group Note  Date:  08/25/2015 Time:  6:33 PM  Group Topic/Focus:  Relapse Prevention Planning:   The focus of this group is to define relapse and discuss the need for planning to combat relapse.  Participation Level:  Active  Participation Quality:  Appropriate  Affect:  Appropriate  Cognitive:  Alert and Appropriate  Insight: Appropriate and Good  Engagement in Group:  Engaged  Modes of Intervention:  Activity and Discussion  Additional Comments:  Pt did participate in group.  Pt states that he still has a urge to drink especially if he is not keeping himself busy.  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  William Perez 08/25/2015, 6:33 PM
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
## 2015-08-25 NOTE — Progress Notes (Signed)
Adult Psychoeducational Group Note  Date:  08/25/2015 Time:  10:08 PM  Group Topic/Focus:  Wrap-Up Group:   The focus of this group is to help patients review their daily goal of treatment and discuss progress on daily workbooks.  Participation Level:  Active  Participation Quality:  Appropriate and Attentive  Affect:  Appropriate  Cognitive:  Appropriate  Insight: Appropriate  Engagement in Group:  Engaged  Modes of Intervention:  Discussion  Additional Comments:  Pt stated his goal for today was to get in touch with housing, which he was unable to do. Pt stated this upset him and caused him to have a bad attitude for part of the day, however he was able to end his day on a better note. Pt stated tomorrow he is going to work on keeping a good attitude.  Everrett Coombewen, Shyia Fillingim C 08/25/2015, 10:08 PM

## 2015-08-25 NOTE — Progress Notes (Signed)
D-  Patient has been noted to be irritable and labile this shift.  Patient had to be redirected due to agitation and aggressive language toward peers.  Patient denies SI, HI and AVH, but reports that the idea of homelessness increases his anxiety and hopelessness.  Patient reported that his SI is triggered by having to live on the streets.   A-  Assess patient for safety, offer medications as prescribed, engage patient in 1:1 therapeutic talks.  R-  Patient was able to contract for safety, patient utilized staff support and coping skills to decrease anxiety.

## 2015-08-25 NOTE — Progress Notes (Signed)
D: [name] rates his day as "irritating as hell". He states some of the patients on the unit have been very dramatic and annoying. There was one young lady on the phone who was loud and very tearful almost to the point of hysterics on the unit earlier. He rates Anxiety 8/10 Depression 6-7/10 Hopelessness 7/10. Denies SI/HI/AVH. Contracts for safety.  A: Encouragement and support given. Q 15 minute checks for patient safety. Medications administered as prescribed.  R: Continue to monitor for patient safety and medication effectiveness.

## 2015-08-26 DIAGNOSIS — F10230 Alcohol dependence with withdrawal, uncomplicated: Secondary | ICD-10-CM

## 2015-08-26 NOTE — Plan of Care (Signed)
Problem: Alteration in mood & ability to function due to Goal: STG-Patient will comply with prescribed medication regimen (Patient will comply with prescribed medication regimen)  Outcome: Progressing Pt complaint with medication regime     

## 2015-08-26 NOTE — [facility] Group Notes (Signed)
[facility] Group Notes:  (Nursing/MHT/Case Management/Adjunct)  Date:  08/26/2015  Time:  2:59 PM  Type of Therapy:  Psychoeducational Skills  Participation Level:  Active  Participation Quality:  Appropriate  Affect:  Appropriate  Cognitive:  Appropriate  Insight:  Appropriate  Engagement in Group:  Engaged  Modes of Intervention:  Discussion  Summary of Progress/Problems: Pt did attend self inventory group.   William Perez 08/26/2015, 2:59 PM 

## 2015-08-26 NOTE — Progress Notes (Signed)
Adult Psychoeducational Group Note  Date:  08/26/2015 Time:  9:40 PM  Group Topic/Focus:  Wrap-Up Group:   The focus of this group is to help patients review their daily goal of treatment and discuss progress on daily workbooks.  Participation Level:  Active  Participation Quality:  Appropriate and Attentive  Affect:  Appropriate  Cognitive:  Alert, Appropriate and Oriented  Insight: Appropriate  Engagement in Group:  Engaged  Modes of Intervention:  Discussion and Education  Additional Comments:  Pt attended and participated in group.  Pt stated that his goal today to keep a good attitude.  Pt reported that he was able to do this most of the day and rated his day a 7/10.    Berlin Hunuttle, Felicity Penix M 08/26/2015, 9:40 PM
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
## 2015-08-26 NOTE — [facility] Group Notes (Signed)
Adult Therapy Group Note  Date:  08/26/2015 Time:  10:00-11:00AM  Group Topic/Focus:   Stages of Change:   The focus of this group is to explain the stages of change and help patients identify changes they want to make upon discharge. Managing Feelings:   We also worked to identify what feelings were present during an anger outburst and develop a plan to handle anger and associated fear in a healthier way upon discharge.  Participation Level:  Active  Participation Quality:  Attentive, Sharing and Supportive  Affect:  Blunted  Cognitive:  Appropriate  Insight: Good  Engagement in Group:  Engaged  Modes of Intervention:  Education and Motivational Interviewing  Additional Comments:  [name] was interactive today, said several times that the exercises done were not applicable to him, but as he spoke and expressed learning through them, he stated that they did indeed apply to him.  Sarina SerGrossman-Orr, Jerre Vandrunen Jo 08/26/2015, 4:24 PM

## 2015-08-26 NOTE — Progress Notes (Signed)
Patient ID: William Perez, male   DOB: 08/07/1956, 59 y.o.   MRN: [id number] D: " I can't watch the show I want, I"m the oldest here, veteran, and can't get any respect" Patient irritable about his interaction with the younger pts on the hall. Pt irritable in his room and demanding his medication. Pt denies SI/HI/AVH and pain. Pt attended and participated in evening wrap up group.  A: Met with pt 1:1. Medications administered as prescribed. Support and encouragement provided.  R: Patient remains safe and complaint with medications.  

## 2015-08-27 NOTE — [facility] Group Notes (Signed)
[facility] Group Notes:  (Clinical Social Work)  08/27/2015  10:00-11:00AM  Summary of Progress/Problems:   The main focus of today's process group was to   1)  discuss the importance of adding supports  2)  define health supports versus unhealthy supports  3)  identify the patient's current unhealthy supports and plan how to handle them  4)  Identify the patient's current healthy supports and plan what to add, as well as how to add them.  An emphasis was placed on using counselor, doctor, therapy groups, 12-step groups, and problem-specific support groups to expand supports.    The patient expressed full comprehension of the concepts presented, and agreed that there is a need to add more supports.  The patient stated his peers within the hospital support are a significant support to him, while his friends/acquaintances out in the world are unhealthy, as he is for himself much of the time.  He stated several times that the [place] police with whom he has contact are very supportive, provide him with food cards, blanket, are non-judgmental, and very kind.  Type of Therapy:  Process Group with Motivational Interviewing  Participation Level:  Active  Participation Quality:  Attentive and Sharing  Affect:  Blunted and Depressed  Cognitive:  Appropriate and Oriented  Insight:  Engaged  Engagement in Therapy:  Engaged  Modes of Intervention:   Education, Teacher, English as a foreign languageupport and Processing, Activity  [name], LCSW 08/27/2015

## 2015-08-27 NOTE — Progress Notes (Signed)
DAR NOTE: Patient presents with bright affect and depressed mood.  Denies pain, auditory and visual hallucinations.  Rates depression at 5, hopelessness at 5, and anxiety at 5.  Maintained on routine safety checks.  Medications given as prescribed.  Support and encouragement offered as needed.  Attended group and participated.  States goal for today is "not getting mad." Patient observed socializing with peers in the dayroom.  Offered no complaint.  Pt's safety ensured with 15 minute and environmental checks. Pt currently denies SI/HI and A/V hallucinations. Pt verbally agrees to seek staff if SI/HI or A/VH occurs and to consult with staff before acting on these thoughts. Will continue POC.
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
## 2015-08-27 NOTE — Progress Notes (Signed)
  D: Patient pleasant and cooperative with care and brightens on approach. Pt Laughing and interacting well with peers in the milieu. Pt participated in group session. A: Q 15 minute safety checks, encourage staff/peer interaction and medication compliance.  R: Patient denies SI or plans to harm himself. Pt compliant with HS medications. No s/s of distress noted.

## 2015-08-27 NOTE — [facility] Group Notes (Signed)
[facility] Group Notes:  (Nursing/MHT/Case Management/Adjunct)  Date:  08/27/2015  Time:  4:05 PM  Type of Therapy:  Psychoeducational Skills  Participation Level:  Active  Participation Quality:  Appropriate  Affect:  Appropriate  Cognitive:  Appropriate  Insight:  Appropriate  Engagement in Group:  Engaged  Modes of Intervention:  Problem-solving  Summary of Progress/Problems: Summary of Progress/Problems: Topic was on leisure and lifestyle changes. Discussed the important of choosing healthy leisure activities. Group encouraged to surround themselves with positive and healthy group/support system when changing to a healthy life style.    William PunchesJane O Adahlia Perez 08/27/2015, 4:05 PM

## 2015-08-27 NOTE — Progress Notes (Signed)
[facility]  08/27/2015  Suan HalterDale Winegarden  MRN:  [id number] Subjective:  William Perez states: "I'm feeling much better today. I feel like I'm making progress."  Objective: William Perez seen and chart reviewed. William Perez is alert/oriented x4, calm, cooperative, and appropriate to situation. William Perez denies suicidal/homicidal ideation and psychosis and does not appear to be responding to internal stimuli. William Perez reports that he is doing better with being able to regulate his emotions. He appears to be optimistic about his care plan and has been participating in groups as well.   Principal Problem: Alcohol dependence with uncomplicated withdrawal (HCC) Diagnosis:   Patient Active Problem List   Diagnosis Date Noted  . Alcohol dependence with uncomplicated withdrawal (HCC) [F10.230]   . PTSD (post-traumatic stress disorder) [F43.10] 12/15/2014  . Bipolar I disorder, most recent episode depressed (HCC) [F31.30] 12/15/2014   Total Time spent with patient: 15 minutes  Past Psychiatric History: see admission H&P  Past Medical History:  Past Medical History  Diagnosis Date  . PTSD (post-traumatic stress disorder)   . Cirrhosis, alcoholic (HCC)   . Acid reflux   . Degenerative arthritis of hip   . H/O: attempted suicide   . Depression   . Anxiety     Past Surgical History  Procedure Laterality Date  . Wrist surgery     Family History: History reviewed. No pertinent family history. Family Psychiatric  History: see admission H and P Social History:  History  Alcohol Use  . 42.0 oz/week  . 70 Cans of beer per week    Comment: 6 to 12 beers daily depending on "state of mind"     History  Drug Use  . 4.00 per week  . Special: Marijuana    Comment: 2 to 3 times weekly    Social History   Social History  . Marital Status: Single    Spouse Name: N/A  . Number of Children: N/A  . Years of Education: N/A   Social History Main Topics  . Smoking status: Former Games developermoker  . Smokeless tobacco: None  . Alcohol Use:  42.0 oz/week    70 Cans of beer per week     Comment: 6 to 12 beers daily depending on "state of mind"  . Drug Use: 4.00 per week    Special: Marijuana     Comment: 2 to 3 times weekly  . Sexual Activity: Yes    Birth Control/ Protection: None   Other Topics Concern  . None   Social History Narrative   Additional Social History:    Pain Medications: See MAR Prescriptions: See MAR Over the Counter: See MAR History of alcohol / drug use?: Yes Longest period of sobriety (when/how long): 3 months Negative Consequences of Use: Work / Programmer, multimediachool, Copywriter, advertisingersonal relationships, Armed forces operational officerLegal, Surveyor, quantityinancial Withdrawal Symptoms: Agitation, Sweats, Fever / Chills Name of Substance 1: Alcohol 1 - Age of First Use: 17 1 - Amount (size/oz): 6 to 8 12 oz. beers daily  1 - Frequency: daily 1 - Duration: 10 years 1 - Last Use / Amount: 08/19/15                  Sleep: Fair  Appetite:  Fair  Current Medications: Current Facility-Administered Medications  Medication Dose Route Frequency Provider Last Rate Last Dose  . alum & mag hydroxide-simeth (MAALOX/MYLANTA) 200-200-20 MG/5ML suspension 30 mL  30 mL Oral Q4H PRN Leata MouseJanardhana Constantina Laseter, MD      . buPROPion (WELLBUTRIN XL) 24 hr tablet 300 mg  300  mg Oral Daily Rachael Fee, MD   300 mg at 08/27/15 1610  . cyclobenzaprine (FLEXERIL) tablet 10 mg  10 mg Oral TID PRN Rachael Fee, MD   10 mg at 08/27/15 9604  . gabapentin (NEURONTIN) capsule 800 mg  800 mg Oral QID Leata Mouse, MD   800 mg at 08/27/15 1653  . magnesium hydroxide (MILK OF MAGNESIA) suspension 30 mL  30 mL Oral Daily PRN Leata Mouse, MD      . multivitamin with minerals tablet 1 tablet  1 tablet Oral Daily Leata Mouse, MD   1 tablet at 08/27/15 0810  . pantoprazole (PROTONIX) EC tablet 40 mg  40 mg Oral BID AC Leata Mouse, MD   40 mg at 08/27/15 1653  . risperiDONE (RISPERDAL) tablet 2 mg  2 mg Oral QHS Leata Mouse, MD   2 mg at  08/26/15 2133  . tamsulosin (FLOMAX) capsule 0.4 mg  0.4 mg Oral Daily Rachael Fee, MD   0.4 mg at 08/27/15 0810  . thiamine (VITAMIN B-1) tablet 100 mg  100 mg Oral Daily Leata Mouse, MD   100 mg at 08/27/15 0811  . traZODone (DESYREL) tablet 150 mg  150 mg Oral QHS PRN,MR X 1 Rachael Fee, MD   150 mg at 08/26/15 2133    Lab Results:  No results found for this or any previous visit (from the past 48 hour(s)).  Physical Findings: AIMS: Facial and Oral Movements Muscles of Facial Expression: None, normal Lips and Perioral Area: None, normal Jaw: None, normal Tongue: None, normal,Extremity Movements Upper (arms, wrists, hands, fingers): None, normal Lower (legs, knees, ankles, toes): None, normal, Trunk Movements Neck, shoulders, hips: None, normal, Overall Severity Severity of abnormal movements (highest score from questions above): None, normal Incapacitation due to abnormal movements: None, normal Patient's awareness of abnormal movements (rate only patient's report): No Awareness, Dental Status Current problems with teeth and/or dentures?: No Does patient usually wear dentures?: No  CIWA:  CIWA-Ar Total: 0 COWS:     Musculoskeletal: Strength & Muscle Tone: within normal limits Gait & Station: normal Patient leans: normal  Psychiatric Specialty Exam: Review of Systems  Constitutional: Negative.   HENT: Negative.   Eyes: Negative.   Respiratory: Negative.   Cardiovascular: Negative.   Gastrointestinal: Negative.   Genitourinary: Negative.   Musculoskeletal: Negative.   Skin: Negative.   Neurological: Negative.   Endo/Heme/Allergies: Negative.   Psychiatric/Behavioral: Positive for depression and substance abuse. The patient is nervous/anxious.   All other systems reviewed and are negative.   Blood pressure 128/85, pulse 92, temperature 97.8 F (36.6 C), temperature source Oral, resp. rate 16, height 5' 10.5" (1.791 m), weight 78.926 kg (174 lb), SpO2  100 %.Body mass index is 24.61 kg/(m^2).  General Appearance: Fairly Groomed  Patent attorney::  Fair  Speech:  Clear and Coherent  Volume:  fluctuates  Mood:  Anxious, Dysphoric and Irritable yet improved  Affect:  Labile  Thought Process:  Coherent and Goal Directed  Orientation:  Full (Time, Place, and Person)  Thought Content:  symptoms events worries concerns yet improving  Suicidal Thoughts:  No  Homicidal Thoughts:  No  Memory:  Immediate;   Fair Recent;   Fair Remote;   Fair  Judgement:  Fair  Insight:  Present and Shallow  Psychomotor Activity:  Restlessness  Concentration:  Fair  Recall:  [name] of Knowledge:Fair  Language: Fair  Akathisia:  No  Handed:  Right  AIMS (if indicated):  Assets:  Desire for Improvement  ADL's:  Intact  Cognition: WNL  Sleep:  Number of Hours: 6.25   Alcohol dependence with uncomplicated withdrawal (HCC), improving, continue regimen below on 08/27/2015  Treatment Plan Summary: Daily contact with patient to assess and evaluate symptoms and progress in treatment and Medication management Supportive approach/coping skills Alcohol dependence; continue the alcohol detox protocol/work a relapse prevention plan  Mood instability: continue the Risperdal 2 mg  Anxiety-agitation-pain; continue the Neurontin Depression; resume the Wellbutrin XL 150 mg in AM  Work with CBT/mindfulness/explore residential treatment options PTSD; continue to allow to process the trauma  Beau Fanny, FNP-BC 08/27/2015, 12:55 PM  Reviewed the information documented and agree with the treatment plan.  Yaretsi Humphres,JANARDHAHA R. 08/28/2015 11:04 AM

## 2015-08-28 NOTE — Plan of Care (Signed)
Problem: Ineffective individual coping Goal: STG: Patient will remain free from self harm Outcome: Progressing Patient has not engaged in self harm, denies SI.  Problem: Diagnosis: Increased Risk For Suicide Attempt Goal: STG-Patient Will Comply With Medication Regime Outcome: Progressing Patient is med compliant.     

## 2015-08-28 NOTE — Progress Notes (Signed)
Adult Psychoeducational Group Note  Date:  08/28/2015 Time:  9:41 PM  Group Topic/Focus:  Wrap-Up Group:   The focus of this group is to help patients review their daily goal of treatment and discuss progress on daily workbooks.  Participation Level:  Active  Participation Quality:  Appropriate and Attentive  Affect:  Appropriate  Cognitive:  Appropriate  Insight: Appropriate  Engagement in Group:  Engaged  Modes of Intervention:  Discussion  Additional Comments:  Pt stated he had a wonderful day today. He popped his back this morning and has had no back pain today. Pt stated he is going to [facility] tomorrow.  Caswell CorwinOwen, Kamari Buch C 08/28/2015, 9:41 PM

## 2015-08-28 NOTE — Progress Notes (Signed)
[facility]  08/28/2015 3:10 PM William Perez  MRN:  [id number] Subjective:  William Perez states that he is still having some issues with his mood. Admits he is quite anxious and has maybe over reacted to what other patients have said or done. He states he realizes he needs to start from scratched. After going to rehab [place] States he did well when he was there.  Principal Problem: Alcohol dependence with uncomplicated withdrawal (HCC) Diagnosis:   Patient Active Problem List   Diagnosis Date Noted  . Alcohol dependence with uncomplicated withdrawal (HCC) [F10.230]   . PTSD (post-traumatic stress disorder) [F43.10] 12/15/2014  . Bipolar I disorder, most recent episode depressed (HCC) [F31.30] 12/15/2014   Total Time spent with patient: 20 minutes  Past Psychiatric History: see Admission H and P  Past Medical History:  Past Medical History  Diagnosis Date  . PTSD (post-traumatic stress disorder)   . Cirrhosis, alcoholic (HCC)   . Acid reflux   . Degenerative arthritis of hip   . H/O: attempted suicide   . Depression   . Anxiety     Past Surgical History  Procedure Laterality Date  . Wrist surgery     Family History: History reviewed. No pertinent family history. Family Psychiatric  History: see admission H and P Social History:  History  Alcohol Use  . 42.0 oz/week  . 70 Cans of beer per week    Comment: 6 to 12 beers daily depending on "state of mind"     History  Drug Use  . 4.00 per week  . Special: Marijuana    Comment: 2 to 3 times weekly    Social History   Social History  . Marital Status: Single    Spouse Name: N/A  . Number of Children: N/A  . Years of Education: N/A   Social History Main Topics  . Smoking status: Former Games developermoker  . Smokeless tobacco: None  . Alcohol Use: 42.0 oz/week    70 Cans of beer per week     Comment: 6 to 12 beers daily depending on "state of mind"  . Drug Use: 4.00 per week    Special:  Marijuana     Comment: 2 to 3 times weekly  . Sexual Activity: Yes    Birth Control/ Protection: None   Other Topics Concern  . None   Social History Narrative   Additional Social History:    Pain Medications: See MAR Prescriptions: See MAR Over the Counter: See MAR History of alcohol / drug use?: Yes Longest period of sobriety (when/how long): 3 months Negative Consequences of Use: Work / Programmer, multimediachool, Copywriter, advertisingersonal relationships, Armed forces operational officerLegal, Surveyor, quantityinancial Withdrawal Symptoms: Agitation, Sweats, Fever / Chills Name of Substance 1: Alcohol 1 - Age of First Use: 17 1 - Amount (size/oz): 6 to 8 12 oz. beers daily  1 - Frequency: daily 1 - Duration: 10 years 1 - Last Use / Amount: 08/19/15                  Sleep: Fair  Appetite:  Fair  Current Medications: Current Facility-Administered Medications  Medication Dose Route Frequency Provider Last Rate Last Dose  . alum & mag hydroxide-simeth (MAALOX/MYLANTA) 200-200-20 MG/5ML suspension 30 mL  30 mL Oral Q4H PRN Leata MouseJanardhana Jonnalagadda, MD      . buPROPion (WELLBUTRIN XL) 24 hr tablet 300 mg  300 mg Oral Daily Rachael FeeIrving A Eurika Sandy, MD   300 mg at 08/28/15  1610  . cyclobenzaprine (FLEXERIL) tablet 10 mg  10 mg Oral TID PRN Rachael Fee, MD   10 mg at 08/28/15 0608  . gabapentin (NEURONTIN) capsule 800 mg  800 mg Oral QID Leata Mouse, MD   800 mg at 08/28/15 1140  . magnesium hydroxide (MILK OF MAGNESIA) suspension 30 mL  30 mL Oral Daily PRN Leata Mouse, MD      . multivitamin with minerals tablet 1 tablet  1 tablet Oral Daily Leata Mouse, MD   1 tablet at 08/28/15 0804  . pantoprazole (PROTONIX) EC tablet 40 mg  40 mg Oral BID AC Leata Mouse, MD   40 mg at 08/28/15 0608  . risperiDONE (RISPERDAL) tablet 2 mg  2 mg Oral QHS Leata Mouse, MD   2 mg at 08/27/15 2103  . tamsulosin (FLOMAX) capsule 0.4 mg  0.4 mg Oral Daily Rachael Fee, MD   0.4 mg at 08/28/15 0804  . thiamine (VITAMIN B-1)  tablet 100 mg  100 mg Oral Daily Leata Mouse, MD   100 mg at 08/28/15 0804  . traZODone (DESYREL) tablet 150 mg  150 mg Oral QHS PRN,MR X 1 Rachael Fee, MD   150 mg at 08/27/15 2103    Lab Results: No results found for this or any previous visit (from the past 48 hour(s)).  Physical Findings: AIMS: Facial and Oral Movements Muscles of Facial Expression: None, normal Lips and Perioral Area: None, normal Jaw: None, normal Tongue: None, normal,Extremity Movements Upper (arms, wrists, hands, fingers): None, normal Lower (legs, knees, ankles, toes): None, normal, Trunk Movements Neck, shoulders, hips: None, normal, Overall Severity Severity of abnormal movements (highest score from questions above): None, normal Incapacitation due to abnormal movements: None, normal Patient's awareness of abnormal movements (rate only patient's report): No Awareness, Dental Status Current problems with teeth and/or dentures?: No Does patient usually wear dentures?: No  CIWA:  CIWA-Ar Total: 0 COWS:     Musculoskeletal: Strength & Muscle Tone: within normal limits Gait & Station: normal Patient leans: normal  Psychiatric Specialty Exam: Review of Systems  Constitutional: Negative.   HENT: Negative.   Eyes: Negative.   Respiratory: Negative.   Cardiovascular: Negative.   Gastrointestinal: Negative.   Genitourinary: Negative.   Musculoskeletal: Negative.   Skin: Negative.   Neurological: Negative.   Endo/Heme/Allergies: Negative.   Psychiatric/Behavioral: Positive for substance abuse. The patient is nervous/anxious.     Blood pressure 106/66, pulse 85, temperature 97.5 F (36.4 C), temperature source Oral, resp. rate 18, height 5' 10.5" (1.791 m), weight 78.926 kg (174 lb), SpO2 100 %.Body mass index is 24.61 kg/(m^2).  General Appearance: Fairly Groomed  Patent attorney::  Fair  Speech:  Clear and Coherent  Volume:  fluctuates  Mood:  Anxious and worried  Affect:  anxious worried   Thought Process:  Coherent and Goal Directed  Orientation:  Full (Time, Place, and Person)  Thought Content:  symptoms events worries concerns  Suicidal Thoughts:  No  Homicidal Thoughts:  No  Memory:  Immediate;   Fair Recent;   Fair Remote;   Fair  Judgement:  Fair  Insight:  Present and Shallow  Psychomotor Activity:  Restlessness  Concentration:  Fair  Recall:  [name] of Knowledge:Fair  Language: Fair  Akathisia:  No  Handed:  Right  AIMS (if indicated):     Assets:  Desire for Improvement  ADL's:  Intact  Cognition: WNL  Sleep:  Number of Hours: 6.5   Treatment Plan  Summary: Daily contact with patient to assess and evaluate symptoms and progress in treatment and Medication management Supportive approach/coping skills Alcohol dependence; continue to work a relapse prevention plan Mood instability; continue to work with the Risperdal Depression; continue to work with the Wellbutrin Anxiety-agitation; continue the Neurontin Use CBT/mindfulness Facilitate admission to [facility] tommorrow Fronnie Urton A 08/28/2015, 3:10 PM

## 2015-08-28 NOTE — Progress Notes (Signed)
D: Pt has appropraite affect and anxious mood.  He reports he has "been a little agitated" today.  Pt reports his goal was to "stay in a good mood" but he has "just been getting agitated anyways."  Pt denies SI/HI, denies hallucinations, denies pain.  Pt has been visible in milieu interacting with peers and staff appropriately.  Pt attended evening group.   A: Introduced self to pt.  Met with pt 1:1 and provided support and encouragement.  Actively listened to pt.  Medications administered per order.  PRN medication administered for muscle spasms and sleep. R: Pt is compliant with medications.  Pt verbally contracts for safety.  Will continue to monitor and assess.
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
## 2015-08-28 NOTE — Progress Notes (Signed)
Recreation Therapy Notes  Date: 01.09.2017 Time: 9:30am Location: [address] Dayroom   Group Topic: Stress Management  Goal Area(s) Addresses:  Patient will actively participate in stress management techniques presented during session.   Behavioral Response: Did not attend.    Romie Keeble L Morris Markham, LRT/CTRS        Kelbie Moro L 08/28/2015 3:01 PM 

## 2015-08-28 NOTE — Progress Notes (Signed)
Patient up and visible in milieu. Rating his depression and hopelessness at a 5/10, anxiety at a 7/10. States his goal to figure out a plan of where to go and is anxious to speak with SW. Patient medicated per orders, emotional support given. He denies SI/HI and remains safe on level III obs. Lawrence MarseillesFriedman, [name]

## 2015-08-28 NOTE — Plan of Care (Signed)
Problem: Alteration in mood & ability to function due to Goal: LTG-Pt reports reduction in suicidal thoughts (Patient reports reduction in suicidal thoughts and is able to verbalize a safety plan for whenever patient is feeling suicidal)  Outcome: Progressing Pt denies SI and verbally contracts for safety.       

## 2015-08-28 NOTE — [facility] Group Notes (Signed)
[facility]   08/28/2015 9:35 AM  Participation Quality:  Appropriate   Mood/Affect:  Depressed and Flat  Depression Rating:  6  Anxiety Rating:  7  Thoughts of Suicide:  No.  Will you contract for safety?   NA  Current AVH:  No  Plan for Discharge/Comments:  Pt reports reduction in agitation. Pt hoping to get into [facility] or [facility]. No withdrawals. Pt reports mood instability at times. "I'm starting to do better though."   Transportation Means: unknown at this time   Supports: brother-pt unable to find contact number   Smart, McKessonHeather LCSW

## 2015-08-28 NOTE — Progress Notes (Signed)
[facility] Accepting doc: Dr. Shawnee KnappGeorge Martin. Pt in process of being IVCED for safe transport. Paperwork faxed to [employer]. [facility] IVC findings will need to be sent to both Sgt Pascal's office and [facility] once received.   Trula SladeHeather Smart, MSW, LCSW Clinical Social Worker 08/28/2015 1:25 PM

## 2015-08-29 MED ORDER — BUPROPION HCL ER (XL) 300 MG PO TB24: 300.0000 mg | ORAL_TABLET | Freq: Every day | ORAL | Status: DC

## 2015-08-29 MED ORDER — GABAPENTIN 800 MG PO TABS: 800.0000 mg | ORAL_TABLET | Freq: Four times a day (QID) | ORAL | Status: DC

## 2015-08-29 MED ORDER — TAMSULOSIN HCL 0.4 MG PO CAPS: 0.4000 mg | ORAL_CAPSULE | Freq: Every day | ORAL | Status: DC

## 2015-08-29 MED ORDER — TRAZODONE HCL 150 MG PO TABS: 150.0000 mg | ORAL_TABLET | Freq: Every evening | ORAL | Status: AC | PRN

## 2015-08-29 MED ORDER — CYCLOBENZAPRINE HCL 10 MG PO TABS: 10.0000 mg | ORAL_TABLET | Freq: Three times a day (TID) | ORAL | Status: DC | PRN

## 2015-08-29 MED ORDER — RISPERIDONE 2 MG PO TABS: 2.0000 mg | ORAL_TABLET | Freq: Every day | ORAL | Status: DC

## 2015-08-29 MED ORDER — PANTOPRAZOLE SODIUM 40 MG PO TBEC: 40.0000 mg | DELAYED_RELEASE_TABLET | Freq: Two times a day (BID) | ORAL | Status: DC

## 2015-08-29 MED ORDER — ADULT MULTIVITAMIN W/MINERALS CH: 1.0000 | ORAL_TABLET | Freq: Every day | ORAL | Status: AC

## 2015-08-29 MED ORDER — RISPERIDONE 2 MG PO TABS: 2.0000 mg | ORAL_TABLET | Freq: Every day | ORAL | Status: AC

## 2015-08-29 MED ORDER — PANTOPRAZOLE SODIUM 40 MG PO TBEC: 40.0000 mg | DELAYED_RELEASE_TABLET | Freq: Two times a day (BID) | ORAL | Status: AC

## 2015-08-29 MED ORDER — TRAZODONE HCL 150 MG PO TABS: 150.0000 mg | ORAL_TABLET | Freq: Every evening | ORAL | Status: DC | PRN

## 2015-08-29 MED ORDER — BUPROPION HCL ER (XL) 300 MG PO TB24: 300.0000 mg | ORAL_TABLET | Freq: Every day | ORAL | Status: AC

## 2015-08-29 MED ORDER — CYCLOBENZAPRINE HCL 10 MG PO TABS: 10.0000 mg | ORAL_TABLET | Freq: Three times a day (TID) | ORAL | Status: AC | PRN

## 2015-08-29 MED ORDER — TAMSULOSIN HCL 0.4 MG PO CAPS: 0.4000 mg | ORAL_CAPSULE | Freq: Every day | ORAL | Status: AC

## 2015-08-29 MED ORDER — GABAPENTIN 800 MG PO TABS: 800.0000 mg | ORAL_TABLET | Freq: Four times a day (QID) | ORAL | Status: AC

## 2015-08-29 NOTE — [facility] Suicide Risk Assessment (Signed)
[facility] INPATIENT:  Family/Significant Other Suicide Prevention Education  Suicide Prevention Education:  Patient Refusal for Family/Significant Other Suicide Prevention Education: The patient William Perez has refused to provide written consent for family/significant other to be provided Family/Significant Other Suicide Prevention Education during admission and/or prior to discharge.  Physician notified.  SPE completed with pt, as pt refused to consent to family contact. SPI pamphlet provided to pt and pt was encouraged to share information with support network, ask questions, and talk about any concerns relating to SPE. Pt denies access to guns/firearms and verbalized understanding of information provided. Mobile Crisis information also provided to pt.   William Perez, William Katzman LCSW 08/29/2015, 9:50 AM

## 2015-08-29 NOTE — Progress Notes (Signed)
Patient ID: William Perez, male   DOB: 1956-08-10, 60 y.o.   MRN: [id number] NSG D/C Note:Pt denies si/hi at this time. States that he will comply with outpt services and take his meds as prescribed. D/C to lobby with [facility] providing transport to [facility].

## 2015-08-29 NOTE — Plan of Care (Signed)
Problem: Alteration in mood & ability to function due to Goal: STG-Patient will attend groups Outcome: Progressing Pt attended evening group on 08/28/15     

## 2015-08-29 NOTE — [facility] Suicide Risk Assessment (Signed)
[facility]   Demographic Factors:  Male and Caucasian  Total Time spent with patient: 20 minutes  Musculoskeletal: Strength & Muscle Tone: within normal limits Gait & Station: normal Patient leans: normal  Psychiatric Specialty Exam: Physical Exam  Review of Systems  Constitutional: Negative.   HENT: Negative.   Eyes: Negative.   Respiratory: Negative.   Cardiovascular: Negative.   Gastrointestinal: Negative.   Genitourinary: Negative.   Musculoskeletal: Positive for back pain and joint pain.  Skin: Negative.   Neurological: Negative.   Endo/Heme/Allergies: Negative.   Psychiatric/Behavioral: The patient is nervous/anxious.     Blood pressure 106/67, pulse 93, temperature 97.4 F (36.3 C), temperature source Oral, resp. rate 18, height 5' 10.5" (1.791 m), weight 78.926 kg (174 lb), SpO2 100 %.Body mass index is 24.61 kg/(m^2).  General Appearance: Fairly Groomed  Patent attorneyye Contact::  Fair  Speech:  Clear and Coherent409  Volume:  Normal  Mood:  Euthymic  Affect:  Appropriate  Thought Process:  Coherent and Goal Directed  Orientation:  Full (Time, Place, and Person)  Thought Content:  plans as he moves on, relapse prevention plan  Suicidal Thoughts:  No  Homicidal Thoughts:  No  Memory:  Immediate;   Fair Recent;   Fair Remote;   Fair  Judgement:  Fair  Insight:  Present and Shallow  Psychomotor Activity:  Normal  Concentration:  Fair  Recall:  FiservFair  Fund of Knowledge:Fair  Language: Fair  Akathisia:  No  Handed:  Right  AIMS (if indicated):     Assets:  Desire for Improvement  Sleep:  Number of Hours: 6.75  Cognition: WNL  ADL's:  Intact   Have you used any form of tobacco in the last 30 days? (Cigarettes, Smokeless Tobacco, Cigars, and/or Pipes): No  Has this patient used any form of tobacco in the last 30 days? (Cigarettes, Smokeless Tobacco, Cigars, and/or Pipes) No  Mental Status Per Nursing Assessment::   On Admission:      Current Mental Status by Physician: In full contact with reality. There are no active SI plans or intent. There are no active S/S of withdrawal. He is willing and motivated to pursue residential treatment   Loss Factors: NA  Historical Factors: NA  Risk Reduction Factors:   NA  Continued Clinical Symptoms:  Bipolar Disorder:   Depressive phase Alcohol/Substance Abuse/Dependencies  Cognitive Features That Contribute To Risk:  Closed-mindedness, Polarized thinking and Thought constriction (tunnel vision)    Suicide Risk:  Minimal: No identifiable suicidal ideation.  Patients presenting with no risk factors but with morbid ruminations; may be classified as minimal risk based on the severity of the depressive symptoms  Principal Problem: Alcohol dependence with uncomplicated withdrawal [facility](HCC) Discharge Diagnoses:  Patient Active Problem List   Diagnosis Date Noted  . Alcohol dependence with uncomplicated withdrawal (HCC) [F10.230]   . PTSD (post-traumatic stress disorder) [F43.10] 12/15/2014  . Bipolar I disorder, most recent episode depressed (HCC) [F31.30] 12/15/2014    Follow-up Information    Follow up with [facility] On 08/29/2015.   Why:  You have been accepted for admission today. [facility] will transport you to facility today.    Contact information:   [address] [place], [place]  Phone: [phone] Fax: [phone]      Plan Of Care/Follow-up recommendations:  Activity:  as tolerated Diet:  regular Follow up [facility] Is patient on multiple antipsychotic therapies at discharge:  No   Has Patient had three or more failed trials of antipsychotic  monotherapy by history:  No  Recommended Plan for Multiple Antipsychotic Therapies: NA    Rolene Andrades A 08/29/2015, 10:06 AM
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
## 2015-08-29 NOTE — Progress Notes (Signed)
D: Pt has appropriate affect and very pleasant mood.  He reports his day was "great" and that his "back popped and it feels a lot better, that Flexeril really helps."  Pt reports his goal today was "don't get upset and I didn't, I'm going to [facility] tomorrow."  Pt reports he feels safe to discharge tomorrow.  Pt denies SI/HI, denies hallucinations, denies pain.  Pt has been visible in milieu interacting with peers and staff appropriately.  Pt attended evening group.   A:  Met with pt 1:1 and provided support and encouragement.  Actively listened to pt.  Medications administered per order.  PRN medication administered for sleep and muscle spasms. R: Pt is compliant with medications.  Pt verbally contracts for safety.  Will continue to monitor and assess.

## 2015-08-29 NOTE — Progress Notes (Signed)
  [facility] :  Will you be returning to the same living situation after discharge: No-[facility] Admitting doc: Dr. Shawnee KnappGeorge Martin.  At discharge, do you have transportation home?: Yes,  [facility]-pt IVCed for safe transport Do you have the ability to pay for your medications: Yes,  [facility]  Release of information consent forms completed and submitted to medical records by CSW.  Patient to Follow up at: Follow-up Information    Follow up with [facility] On 08/29/2015.   Why:  You have been accepted for admission today. [facility] will transport you to facility today.    Contact information:   [address] [place], [place]  Phone: [phone] Fax: [phone]      Next level of care provider has [facility] to [facility] Link:no  Safety Planning and Suicide Prevention discussed: Yes,  SPE completed with pt, as he refused to consent to family contact.   Have you used any form of tobacco in the last 30 days? (Cigarettes, Smokeless Tobacco, Cigars, and/or Pipes): No  Has patient been referred to the Quitline?: N/A patient is not a smoker  Patient has been referred for addiction treatment: Yes-see above.   Smart, Sumayya Muha LCSW 08/29/2015, 9:50 AM

## 2016-05-27 ENCOUNTER — Emergency Department (HOSPITAL_COMMUNITY)
Admission: EM | Admit: 2016-05-27 | Discharge: 2016-05-27 | Disposition: A | Discharge status: Home / Self Care | Payer: Medicaid Other | Attending: Emergency Medicine | Admitting: Emergency Medicine

## 2016-05-27 ENCOUNTER — Encounter (HOSPITAL_COMMUNITY): Payer: Self-pay | Admitting: Emergency Medicine

## 2016-05-27 DIAGNOSIS — Z87891 Personal history of nicotine dependence: Secondary | ICD-10-CM | POA: Diagnosis not present

## 2016-05-27 DIAGNOSIS — Z79899 Other long term (current) drug therapy: Secondary | ICD-10-CM | POA: Diagnosis not present

## 2016-05-27 DIAGNOSIS — F419 Anxiety disorder, unspecified: Secondary | ICD-10-CM | POA: Insufficient documentation
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
## 2016-05-27 NOTE — Discharge Instructions (Signed)
Follow-up with your primary care physician as needed.  Take your medication as prescribed

## 2016-05-27 NOTE — ED Notes (Signed)
No respiratory or acute distress noted alert and oriented x 3 refused food or blanket offered.

## 2017-06-23 IMAGING — CT CT ABD-PELV W/ CM
2 of 6 series · 15 of 46 positions shown, 17 images · IV contrast (Omni 300)
Comparison: No priors.

CLINICAL DATA: 59-year-old male complaining of pain throughout the
entire lower abdomen. Nausea and vomiting. Black stools.

EXAM:
CT ABDOMEN AND PELVIS WITH CONTRAST
TECHNIQUE: Multidetector CT imaging of the abdomen and pelvis was performed
using the standard protocol following bolus administration of
intravenous contrast.
CONTRAST:  100mL OMNIPAQUE IOHEXOL 300 MG/ML  SOLN

[Series 2: a/p w/ 5mm · axial · 0.88mm/px · z∈[+742,+1192]mm · 12 of 102 slices shown, 14 images]
[im 6/102  soft-tissue]
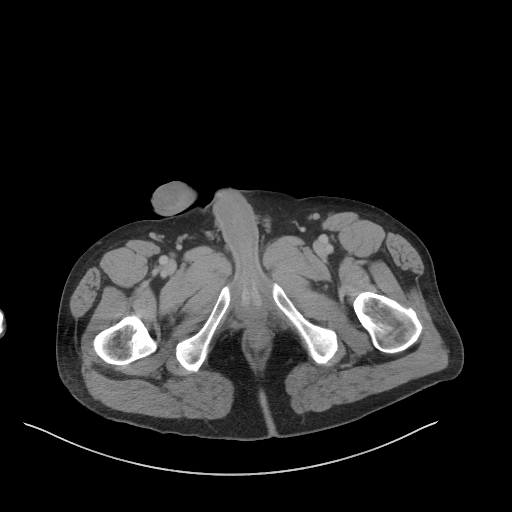
[im 6/102  bone]
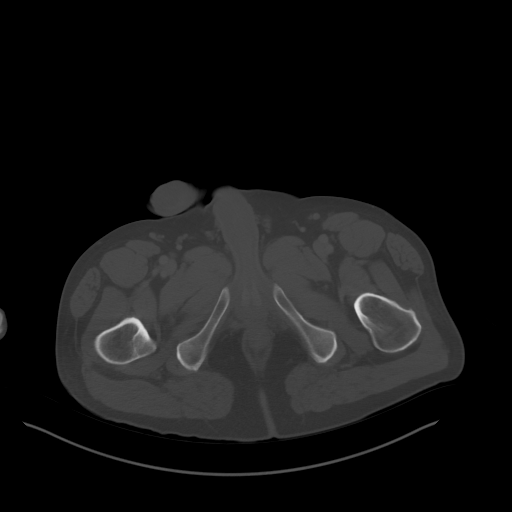
[im 17/102  soft-tissue]
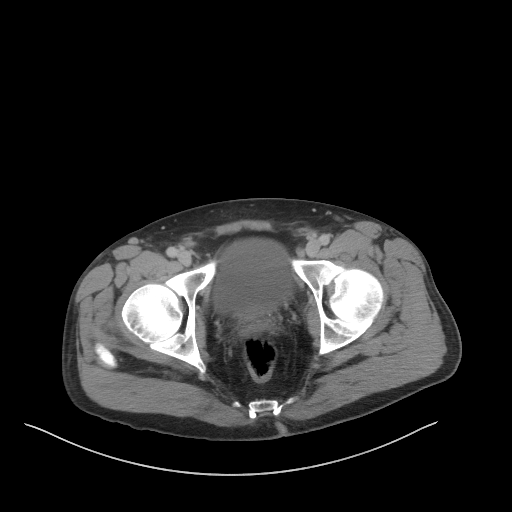
[im 23/102  soft-tissue]
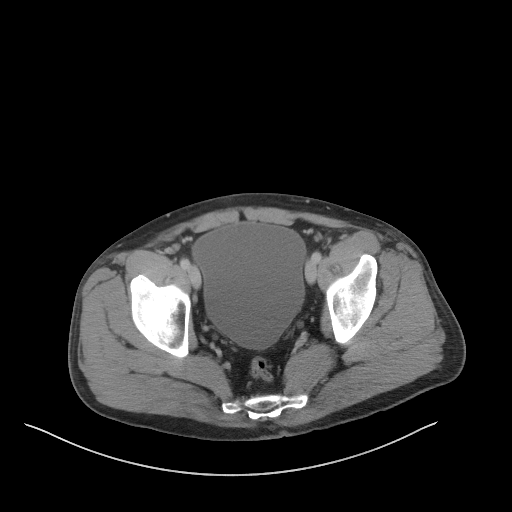
[im 29/102  soft-tissue]
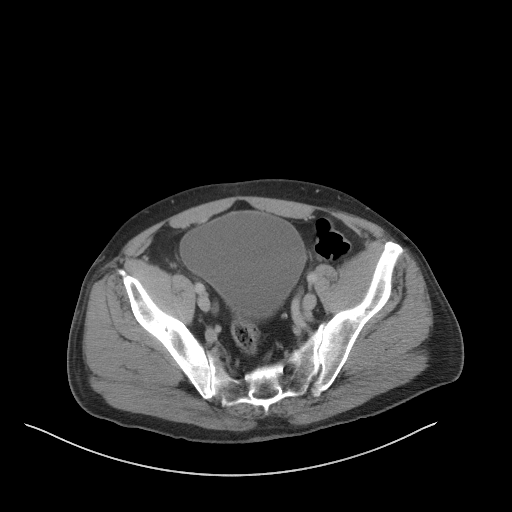
[im 40/102  soft-tissue]
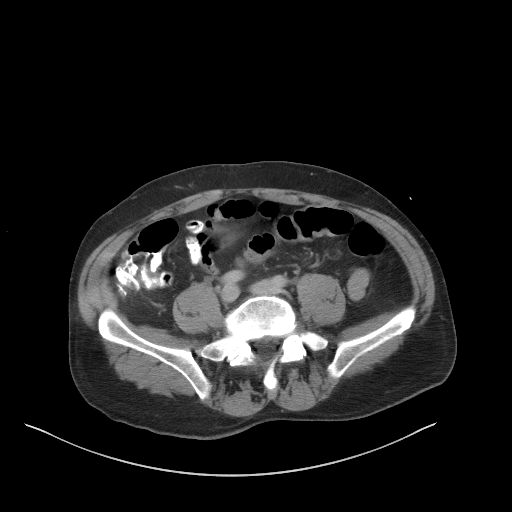
[im 45/102  soft-tissue]
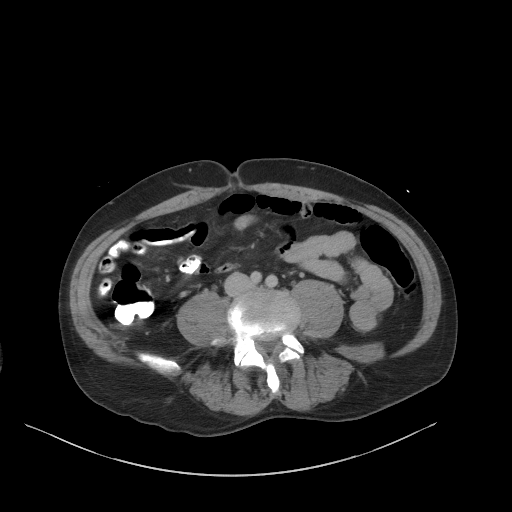
[im 57/102  soft-tissue]
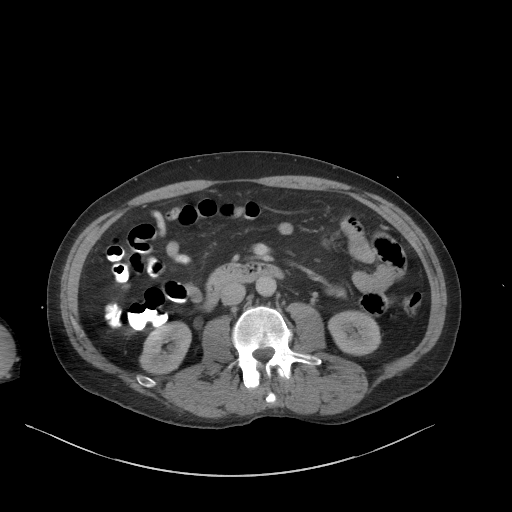
[im 62/102  soft-tissue]
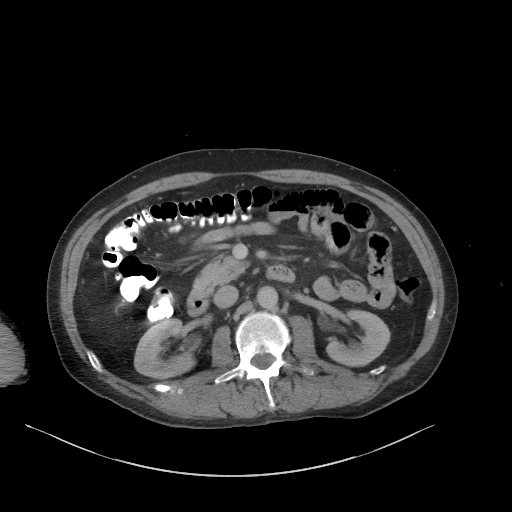
[im 73/102  soft-tissue]
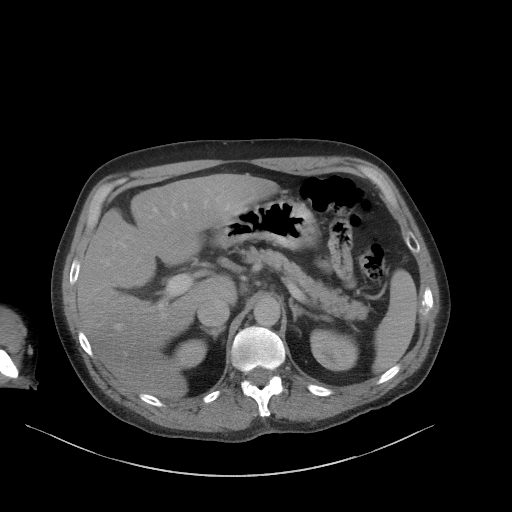
[im 73/102  bone]
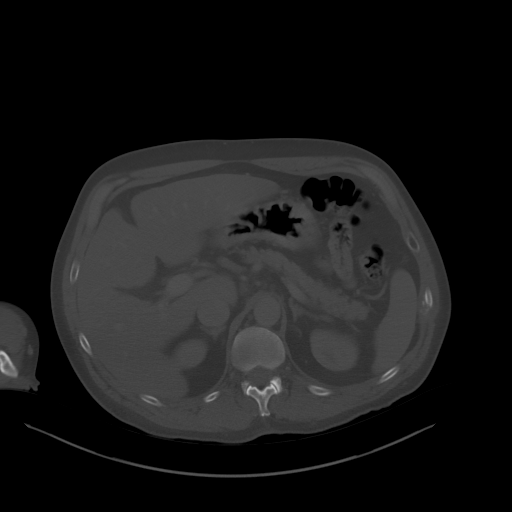
[im 79/102  soft-tissue]
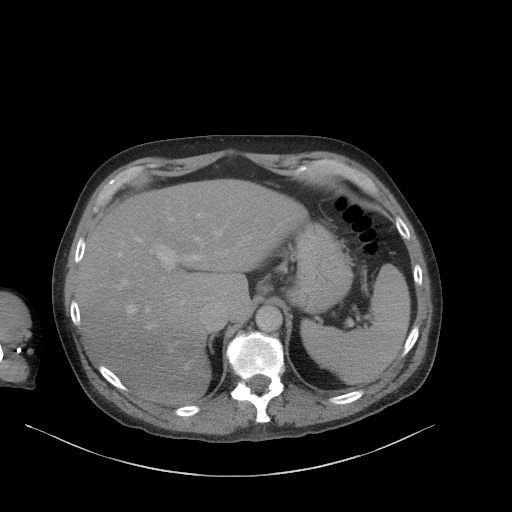
[im 85/102  soft-tissue]
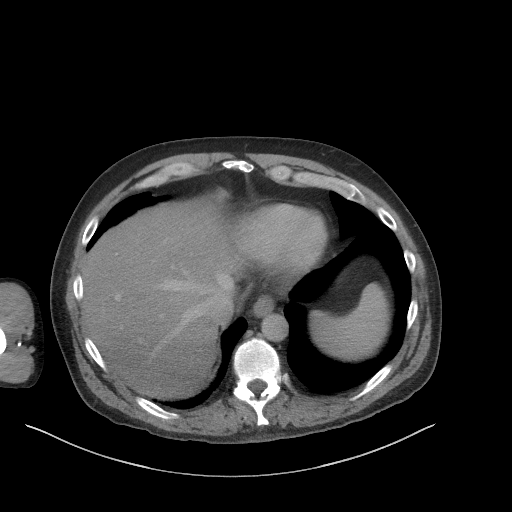
[im 96/102  soft-tissue]
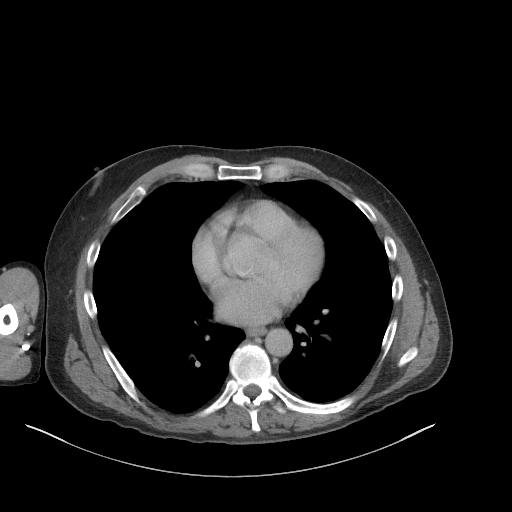

[Series 7: a/p w/ cor · coronal · 0.90mm/px · 3 of 150 slices shown]
[im 50/150  soft-tissue]
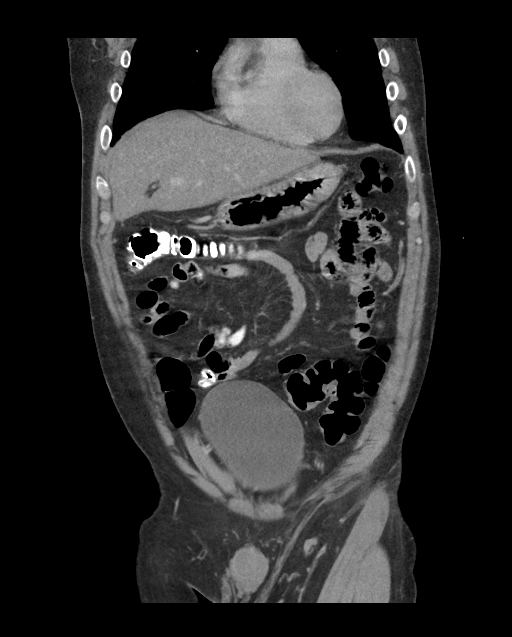
[im 67/150  soft-tissue]
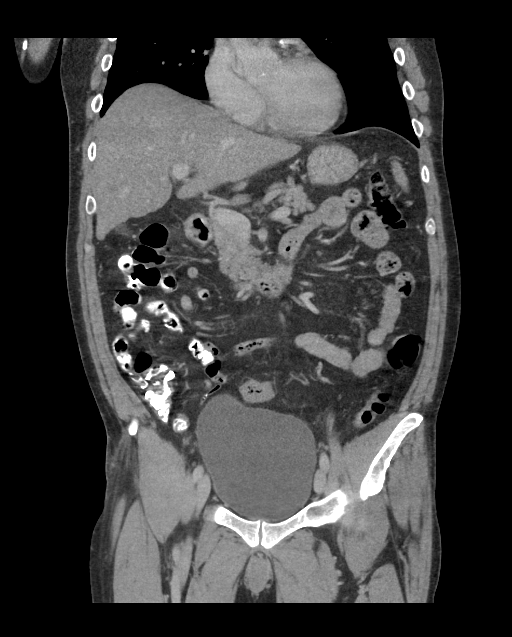
[im 83/150  soft-tissue]
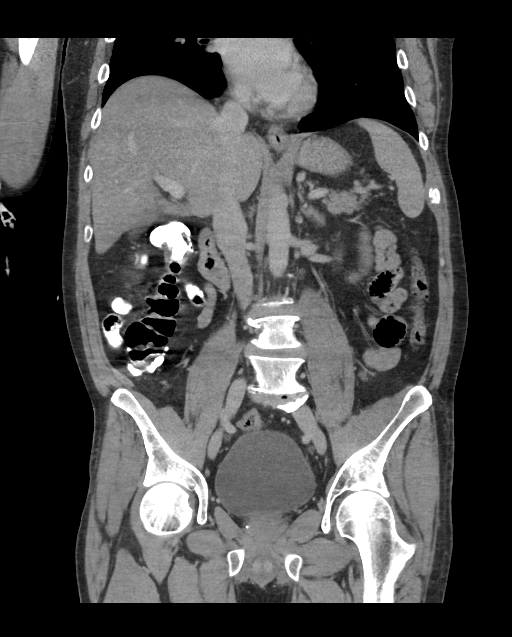

[15 of 46 positions shown; findings below may reference images not displayed]

FINDINGS: Lower chest: Atherosclerotic calcifications in the left anterior
descending coronary artery.

Hepatobiliary: No cystic or solid hepatic lesions. Diffuse low
attenuation throughout the hepatic parenchyma, compatible with
hepatic steatosis. Calcified granuloma in the right lobe of the
liver. Slightly nodular contour of the liver, compatible with early
changes of cirrhosis. No intra or extrahepatic biliary ductal
dilatation. Mild edema of the gallbladder wall throughout the
fundus. Gallbladder is otherwise normal in appearance. Specifically,
no gallstones. Additionally, the gallbladder is nearly decompressed.

Pancreas: No pancreatic mass. No pancreatic ductal dilatation. No
pancreatic or peripancreatic fluid or inflammatory changes.

Spleen: Unremarkable.

Adrenals/Urinary Tract: Bilateral adrenal glands and bilateral
kidneys are normal in appearance. No hydroureteronephrosis. Urinary
bladder is normal in appearance.

Stomach/Bowel: Normal appearance of the stomach. No pathologic
dilatation of small bowel or colon. Normal appendix.

Vascular/Lymphatic: Mild atherosclerosis throughout the abdominal
and pelvic vasculature, without evidence of aneurysm or dissection.
Portal vein is mildly dilated measuring 18 mm in diameter. No
lymphadenopathy noted in the abdomen or pelvis.

Reproductive: Prostate gland and seminal vesicles are normal in
appearance.

Other: No significant volume of ascites.  No pneumoperitoneum.

Musculoskeletal: There are no aggressive appearing lytic or blastic
lesions noted in the visualized portions of the skeleton.
IMPRESSION: 1. No acute findings in the abdomen or pelvis to account for the
patient's symptoms.
2. Morphologic changes in the liver indicative of early cirrhosis
with associated hepatic steatosis, and dilated portal vein,
indicative of developing portal hypertension.
3. Mild edema in the wall of the gallbladder in the fundal portion.
This is likely related to intrinsic liver disease, as there are no
gallstones or surrounding inflammatory changes at this time.
4. Atherosclerosis, including left anterior descending coronary
artery disease.

## 2019-01-18 DEATH — deceased
